# Patient Record
Sex: Male | Born: 1935 | Race: White | Hispanic: No | Marital: Married | Smoking: Former smoker
Health system: Southern US, Community
[De-identification: ages and names within clinical notes are randomized; demographics above are authoritative.]

## PROBLEM LIST (undated history)

## (undated) DIAGNOSIS — E785 Hyperlipidemia, unspecified: Secondary | ICD-10-CM

## (undated) DIAGNOSIS — H9190 Unspecified hearing loss, unspecified ear: Secondary | ICD-10-CM

## (undated) DIAGNOSIS — R0609 Other forms of dyspnea: Secondary | ICD-10-CM

## (undated) DIAGNOSIS — I2510 Atherosclerotic heart disease of native coronary artery without angina pectoris: Secondary | ICD-10-CM

## (undated) DIAGNOSIS — I10 Essential (primary) hypertension: Secondary | ICD-10-CM

## (undated) DIAGNOSIS — S6992XA Unspecified injury of left wrist, hand and finger(s), initial encounter: Secondary | ICD-10-CM

## (undated) DIAGNOSIS — T82857A Stenosis of cardiac prosthetic devices, implants and grafts, initial encounter: Secondary | ICD-10-CM

## (undated) DIAGNOSIS — R296 Repeated falls: Secondary | ICD-10-CM

---

## 2003-04-24 LAB — HM COLONOSCOPY

## 2004-04-30 ENCOUNTER — Ambulatory Visit: Payer: Self-pay | Admitting: Family Medicine

## 2004-05-28 ENCOUNTER — Ambulatory Visit: Payer: Self-pay | Admitting: Family Medicine

## 2005-01-07 ENCOUNTER — Ambulatory Visit: Payer: Self-pay | Admitting: Family Medicine

## 2006-12-06 ENCOUNTER — Ambulatory Visit: Payer: Self-pay | Admitting: Unknown Physician Specialty

## 2006-12-30 ENCOUNTER — Ambulatory Visit: Payer: Self-pay | Admitting: Family Medicine

## 2008-08-08 ENCOUNTER — Ambulatory Visit: Payer: Self-pay | Admitting: Family Medicine

## 2009-03-07 ENCOUNTER — Ambulatory Visit: Payer: Self-pay | Admitting: General Surgery

## 2009-03-12 ENCOUNTER — Ambulatory Visit: Payer: Self-pay | Admitting: General Surgery

## 2009-03-12 HISTORY — PX: HERNIA REPAIR: SHX51

## 2010-11-29 ENCOUNTER — Emergency Department: Payer: Self-pay | Admitting: *Deleted

## 2010-12-03 ENCOUNTER — Ambulatory Visit: Payer: Self-pay | Admitting: Family Medicine

## 2010-12-17 ENCOUNTER — Inpatient Hospital Stay: Payer: Self-pay | Admitting: *Deleted

## 2011-01-07 ENCOUNTER — Ambulatory Visit: Payer: Self-pay | Admitting: Cardiology

## 2011-02-10 DIAGNOSIS — E785 Hyperlipidemia, unspecified: Secondary | ICD-10-CM

## 2011-02-10 DIAGNOSIS — I10 Essential (primary) hypertension: Secondary | ICD-10-CM

## 2011-02-10 DIAGNOSIS — I498 Other specified cardiac arrhythmias: Secondary | ICD-10-CM

## 2011-02-17 ENCOUNTER — Ambulatory Visit: Payer: Self-pay | Admitting: Cardiology

## 2011-03-17 DIAGNOSIS — I499 Cardiac arrhythmia, unspecified: Secondary | ICD-10-CM

## 2011-03-17 DIAGNOSIS — I2510 Atherosclerotic heart disease of native coronary artery without angina pectoris: Secondary | ICD-10-CM

## 2011-03-17 DIAGNOSIS — I359 Nonrheumatic aortic valve disorder, unspecified: Secondary | ICD-10-CM

## 2011-03-18 ENCOUNTER — Other Ambulatory Visit: Payer: Self-pay | Admitting: *Deleted

## 2011-03-18 MED ORDER — AMLODIPINE BESYLATE 5 MG PO TABS: 5.0000 mg | ORAL_TABLET | Freq: Every day | ORAL | Status: AC

## 2011-03-18 MED ORDER — MOMETASONE FURO-FORMOTEROL FUM 200-5 MCG/ACT IN AERO: 2.0000 | INHALATION_SPRAY | Freq: Two times a day (BID) | RESPIRATORY_TRACT | Status: DC

## 2011-03-18 MED ORDER — METOPROLOL SUCCINATE ER 50 MG PO TB24
50.0000 mg | ORAL_TABLET | Freq: Every day | ORAL | Status: DC
Start: 2011-03-18 — End: 2017-02-16

## 2011-03-18 MED ORDER — FUROSEMIDE 80 MG PO TABS
80.0000 mg | ORAL_TABLET | Freq: Every day | ORAL | Status: DC
Start: 2011-03-18 — End: 2016-07-16

## 2011-03-18 MED ORDER — POTASSIUM CHLORIDE 20 MEQ PO PACK: 20.0000 meq | PACK | Freq: Every day | ORAL | Status: DC

## 2011-03-18 MED ORDER — PRAVASTATIN SODIUM 20 MG PO TABS: 20.0000 mg | ORAL_TABLET | Freq: Every day | ORAL | Status: AC

## 2011-03-18 NOTE — Telephone Encounter (Signed)
rx sent to pharmacy by e-script  

## 2011-04-29 ENCOUNTER — Ambulatory Visit: Payer: Self-pay | Admitting: Internal Medicine

## 2011-06-22 ENCOUNTER — Other Ambulatory Visit: Payer: Medicare Other

## 2011-07-14 ENCOUNTER — Other Ambulatory Visit: Payer: Medicare Other

## 2011-07-21 ENCOUNTER — Other Ambulatory Visit: Payer: Medicare Other

## 2013-07-07 DIAGNOSIS — I10 Essential (primary) hypertension: Secondary | ICD-10-CM | POA: Insufficient documentation

## 2013-07-07 DIAGNOSIS — I359 Nonrheumatic aortic valve disorder, unspecified: Secondary | ICD-10-CM | POA: Insufficient documentation

## 2013-11-09 DIAGNOSIS — I6529 Occlusion and stenosis of unspecified carotid artery: Secondary | ICD-10-CM | POA: Insufficient documentation

## 2013-12-14 LAB — BASIC METABOLIC PANEL
BUN: 12 mg/dL (ref 4–21)
Creatinine: 0.9 mg/dL (ref 0.6–1.3)
GLUCOSE: 107 mg/dL
POTASSIUM: 3.5 mmol/L (ref 3.4–5.3)
SODIUM: 140 mmol/L (ref 137–147)

## 2013-12-14 LAB — TSH: TSH: 2.87 u[IU]/mL (ref 0.41–5.90)

## 2013-12-14 LAB — CBC AND DIFFERENTIAL
HEMATOCRIT: 36 % — AB (ref 41–53)
Hemoglobin: 12.8 g/dL — AB (ref 13.5–17.5)
NEUTROS ABS: 5 /uL
Platelets: 186 10*3/uL (ref 150–399)
WBC: 7.5 10^3/mL

## 2013-12-14 LAB — LIPID PANEL
CHOLESTEROL: 171 mg/dL (ref 0–200)
HDL: 41 mg/dL (ref 35–70)
LDL Cholesterol: 104 mg/dL
LDL/HDL RATIO: 2.5
Triglycerides: 128 mg/dL (ref 40–160)

## 2013-12-14 LAB — HEPATIC FUNCTION PANEL
ALT: 11 U/L (ref 10–40)
AST: 16 U/L (ref 14–40)
Alkaline Phosphatase: 67 U/L (ref 25–125)

## 2014-07-18 LAB — HEMOGLOBIN A1C: Hgb A1c MFr Bld: 5.5 % (ref 4.0–6.0)

## 2015-01-15 DIAGNOSIS — E0590 Thyrotoxicosis, unspecified without thyrotoxic crisis or storm: Secondary | ICD-10-CM | POA: Insufficient documentation

## 2015-01-15 DIAGNOSIS — I509 Heart failure, unspecified: Secondary | ICD-10-CM | POA: Insufficient documentation

## 2015-01-15 DIAGNOSIS — J449 Chronic obstructive pulmonary disease, unspecified: Secondary | ICD-10-CM | POA: Insufficient documentation

## 2015-01-15 DIAGNOSIS — E785 Hyperlipidemia, unspecified: Secondary | ICD-10-CM | POA: Insufficient documentation

## 2015-01-15 DIAGNOSIS — E039 Hypothyroidism, unspecified: Secondary | ICD-10-CM | POA: Insufficient documentation

## 2015-01-15 DIAGNOSIS — M1990 Unspecified osteoarthritis, unspecified site: Secondary | ICD-10-CM | POA: Insufficient documentation

## 2015-01-15 DIAGNOSIS — I10 Essential (primary) hypertension: Secondary | ICD-10-CM | POA: Insufficient documentation

## 2015-01-15 DIAGNOSIS — I38 Endocarditis, valve unspecified: Secondary | ICD-10-CM | POA: Insufficient documentation

## 2015-01-15 DIAGNOSIS — S2220XA Unspecified fracture of sternum, initial encounter for closed fracture: Secondary | ICD-10-CM | POA: Insufficient documentation

## 2015-01-15 DIAGNOSIS — R7303 Prediabetes: Secondary | ICD-10-CM | POA: Insufficient documentation

## 2015-01-16 ENCOUNTER — Ambulatory Visit (INDEPENDENT_AMBULATORY_CARE_PROVIDER_SITE_OTHER): Payer: Medicare PPO | Admitting: Family Medicine

## 2015-01-16 ENCOUNTER — Encounter: Payer: Self-pay | Admitting: Family Medicine

## 2015-01-16 ENCOUNTER — Ambulatory Visit: Payer: Self-pay | Admitting: Family Medicine

## 2015-01-16 VITALS — BP 114/60 | HR 68 | Temp 97.9°F | Resp 16 | Wt 168.0 lb

## 2015-01-16 DIAGNOSIS — Z23 Encounter for immunization: Secondary | ICD-10-CM

## 2015-01-16 DIAGNOSIS — R7303 Prediabetes: Secondary | ICD-10-CM

## 2015-01-16 DIAGNOSIS — I10 Essential (primary) hypertension: Secondary | ICD-10-CM

## 2015-01-16 DIAGNOSIS — E785 Hyperlipidemia, unspecified: Secondary | ICD-10-CM

## 2015-01-16 DIAGNOSIS — E0590 Thyrotoxicosis, unspecified without thyrotoxic crisis or storm: Secondary | ICD-10-CM | POA: Diagnosis not present

## 2015-01-16 NOTE — Progress Notes (Signed)
Patient ID: Charles Strickland, male   DOB: [birth year], 81 y.o.   MRN: [id number]    Subjective:  HPI  Pre-Diabetes, Follow-up:   Lab Results  Component Value Date   HGBA1C 5.5 07/18/2014   Last seen for diabetes 6 months ago.  Management since then includes none. He reports good compliance with treatment. He is not having side effects.  Current symptoms include none.  Current Insulin Regimen: n/a  ------------------------------------------------------------------------   Hypertension, follow-up:  BP Readings from Last 3 Encounters:  01/16/15 114/60  07/18/14 164/62    He was last seen for hypertension 6 months ago.  BP at that visit was 164/62. Management since that visit includes none.He reports good compliance with treatment. He is not having side effects.  He is not exercising. He is adherent to low salt diet.   Outside blood pressures are not being checked. Patient denies chest pain, chest pressure/discomfort, dyspnea, exertional chest pressure/discomfort, fatigue, irregular heart beat and palpitations.    ------------------------------------------------------------------------    Lipid/Cholesterol, Follow-up:   Last seen for this 6 months ago.  Management since that visit includes none.  Last Lipid Panel:    Component Value Date/Time   CHOL 171 12/14/2013   TRIG 128 12/14/2013   HDL 41 12/14/2013   LDLCALC 104 12/14/2013    He reports good compliance with treatment. He is not having side effects.   Wt Readings from Last 3 Encounters:  01/16/15 168 lb (76.204 kg)  07/18/14 166 lb (75.297 kg)    ------------------------------------------------------------------------       Prior to Admission medications   Medication Sig Start Date End Date Taking? Authorizing Provider  albuterol (VENTOLIN HFA) 108 (90 BASE) MCG/ACT inhaler Inhale into the lungs. 05/17/12  Yes Historical Provider, MD  amLODipine (NORVASC) 5 MG tablet Take 1 tablet (5 mg total)  by mouth daily. 03/18/11  Yes Karie Schwalbe, MD  amLODipine (NORVASC) 5 MG tablet Take by mouth. 07/18/14  Yes Historical Provider, MD  aspirin 81 MG tablet Take by mouth. 05/17/12  Yes Historical Provider, MD  benazepril (LOTENSIN) 40 MG tablet Take by mouth. 07/18/14  Yes Historical Provider, MD  diphenhydrAMINE (ALLERGY MEDICATION) 25 MG tablet Take by mouth. 05/17/12  Yes Historical Provider, MD  furosemide (LASIX) 80 MG tablet Take 1 tablet (80 mg total) by mouth daily. 03/18/11  Yes Karie Schwalbe, MD  losartan-hydrochlorothiazide (HYZAAR) 100-25 MG tablet Take by mouth. 05/17/12  Yes Historical Provider, MD  Methylcobalamin (B12-ACTIVE) 1 MG CHEW Chew by mouth. 05/17/12  Yes Historical Provider, MD  metoprolol (TOPROL-XL) 50 MG 24 hr tablet Take 1 tablet (50 mg total) by mouth daily. 03/18/11  Yes Karie Schwalbe, MD  Mometasone Furo-Formoterol Fum (DULERA) 200-5 MCG/ACT AERO Inhale 2 puffs into the lungs 2 (two) times daily. 03/18/11  Yes Karie Schwalbe, MD  Multiple Vitamin tablet Take by mouth. 05/17/12  Yes Historical Provider, MD  potassium chloride (KLOR-CON) 20 MEQ packet Take 20 mEq by mouth daily. 03/18/11  Yes Karie Schwalbe, MD  pravastatin (PRAVACHOL) 20 MG tablet Take 1 tablet (20 mg total) by mouth daily. 03/18/11  Yes Karie Schwalbe, MD  Sennosides 25 MG TABS Take by mouth. 05/17/12  Yes Historical Provider, MD    Patient Active Problem List   Diagnosis Date Noted  . CCF (congestive cardiac failure) (HCC) 01/15/2015  . CAFL (chronic airflow limitation) (HCC) 01/15/2015  . Borderline diabetes 01/15/2015  . Heart valve disease 01/15/2015  . HLD (hyperlipidemia) 01/15/2015  . BP (  high blood pressure) 01/15/2015  . Hyperthyroidism 01/15/2015  . Adult hypothyroidism 01/15/2015  . Arthritis, degenerative 01/15/2015  . Closed fracture of sternum 01/15/2015  . Carotid artery occlusion 11/09/2013  . Benign essential HTN 07/07/2013  . Aortic valve defect 07/07/2013     History reviewed. No pertinent past medical history.  Social History   Social History  . Marital Status: Married    Spouse Name: N/A  . Number of Children: N/A  . Years of Education: N/A   Occupational History  . Not on file.   Social History Main Topics  . Smoking status: Former Smoker -- 3.00 packs/day  . Smokeless tobacco: Not on file     Comment: quit 40 years ago  . Alcohol Use: No  . Drug Use: No  . Sexual Activity: Not on file   Other Topics Concern  . Not on file   Social History Narrative    No Known Allergies  Review of Systems  Constitutional: Negative.   HENT: Negative.   Eyes: Negative.   Respiratory: Negative.   Cardiovascular: Negative.   Gastrointestinal: Negative.   Genitourinary: Negative.   Musculoskeletal: Negative.   Skin: Negative.   Neurological: Negative.   Endo/Heme/Allergies: Negative.   Psychiatric/Behavioral: Negative.     Immunization History  Administered Date(s) Administered  . Pneumococcal Polysaccharide-23 02/07/2013  . Td 06/08/2007   Objective:  BP 114/60 mmHg  Pulse 68  Temp(Src) 97.9 F (36.6 C) (Oral)  Resp 16  Wt 168 lb (76.204 kg)  Physical Exam  Constitutional: He is oriented to person, place, and time and well-developed, well-nourished, and in no distress.  HENT:  Head: Normocephalic and atraumatic.  Right Ear: External ear normal.  Left Ear: External ear normal.  Nose: Nose normal.  Eyes: Conjunctivae are normal.  Neck: Neck supple.  Cardiovascular: Normal rate and regular rhythm.   Pulmonary/Chest: Effort normal and breath sounds normal.  Abdominal: Soft.  Neurological: He is alert and oriented to person, place, and time.  Skin: Skin is warm and dry.  Psychiatric: Mood, memory, affect and judgment normal.    Lab Results  Component Value Date   WBC 7.5 12/14/2013   HGB 12.8* 12/14/2013   HCT 36* 12/14/2013   PLT 186 12/14/2013   CHOL 171 12/14/2013   TRIG 128 12/14/2013   HDL 41  12/14/2013   LDLCALC 104 12/14/2013   TSH 2.87 12/14/2013   HGBA1C 5.5 07/18/2014    CMP     Component Value Date/Time   NA 140 12/14/2013   K 3.5 12/14/2013   BUN 12 12/14/2013   CREATININE 0.9 12/14/2013   AST 16 12/14/2013   ALT 11 12/14/2013   ALKPHOS 67 12/14/2013    Assessment and Plan :  1. Benign essential HTN  - CBC with Differential/Platelet - TSH  2. Hyperthyroidism  - TSH  3. Borderline diabetes  - Hemoglobin A1c - Comprehensive metabolic panel  4. HLD (hyperlipidemia)  - Lipid Panel With LDL/HDL Ratio - Comprehensive metabolic panel  5. Need for influenza vaccination  - Flu vaccine HIGH DOSE PF  6. Need for pneumococcal vaccination  - Pneumococcal conjugate vaccine 13-valent IM  7. Prediabetes  - Hemoglobin A1c  I have done the exam and reviewed the above chart and it is accurate to the best of my knowledge.  Julieanne Mansonichard Cereniti Curb MD [facility] 01/16/2015 1:59 PM

## 2015-01-17 LAB — CBC WITH DIFFERENTIAL/PLATELET
BASOS: 1 %
Basophils Absolute: 0.1 10*3/uL (ref 0.0–0.2)
EOS (ABSOLUTE): 0.4 10*3/uL (ref 0.0–0.4)
Eos: 5 %
Hematocrit: 36.8 % — ABNORMAL LOW (ref 37.5–51.0)
Hemoglobin: 12.5 g/dL — ABNORMAL LOW (ref 12.6–17.7)
IMMATURE GRANULOCYTES: 0 %
Immature Grans (Abs): 0 10*3/uL (ref 0.0–0.1)
Lymphocytes Absolute: 1.8 10*3/uL (ref 0.7–3.1)
Lymphs: 22 %
MCH: 29.1 pg (ref 26.6–33.0)
MCHC: 34 g/dL (ref 31.5–35.7)
MCV: 86 fL (ref 79–97)
MONOS ABS: 0.7 10*3/uL (ref 0.1–0.9)
Monocytes: 8 %
NEUTROS PCT: 64 %
Neutrophils Absolute: 5.2 10*3/uL (ref 1.4–7.0)
PLATELETS: 189 10*3/uL (ref 150–379)
RBC: 4.3 x10E6/uL (ref 4.14–5.80)
RDW: 14.7 % (ref 12.3–15.4)
WBC: 8.2 10*3/uL (ref 3.4–10.8)

## 2015-01-17 LAB — LIPID PANEL WITH LDL/HDL RATIO
CHOLESTEROL TOTAL: 162 mg/dL (ref 100–199)
HDL: 34 mg/dL — ABNORMAL LOW (ref >39)
LDL CALC: 102 mg/dL — AB (ref 0–99)
LDL/HDL RATIO: 3 ratio (ref 0.0–3.6)
Triglycerides: 131 mg/dL (ref 0–149)
VLDL Cholesterol Cal: 26 mg/dL (ref 5–40)

## 2015-01-17 LAB — COMPREHENSIVE METABOLIC PANEL
ALBUMIN: 4.3 g/dL (ref 3.2–4.6)
ALK PHOS: 60 IU/L (ref 39–117)
ALT: 14 IU/L (ref 0–44)
AST: 20 IU/L (ref 0–40)
Albumin/Globulin Ratio: 1.8 (ref 1.1–2.5)
BUN/Creatinine Ratio: 18 (ref 10–22)
BUN: 17 mg/dL (ref 10–36)
Bilirubin Total: 0.5 mg/dL (ref 0.0–1.2)
CO2: 27 mmol/L (ref 18–29)
CREATININE: 0.93 mg/dL (ref 0.76–1.27)
Calcium: 10.2 mg/dL (ref 8.6–10.2)
Chloride: 99 mmol/L (ref 97–106)
GFR calc Af Amer: 83 mL/min/{1.73_m2} (ref >59)
GFR calc non Af Amer: 72 mL/min/{1.73_m2} (ref >59)
GLUCOSE: 119 mg/dL — AB (ref 65–99)
Globulin, Total: 2.4 g/dL (ref 1.5–4.5)
Potassium: 3.7 mmol/L (ref 3.5–5.2)
Sodium: 142 mmol/L (ref 136–144)
Total Protein: 6.7 g/dL (ref 6.0–8.5)

## 2015-01-17 LAB — TSH: TSH: 2.12 u[IU]/mL (ref 0.450–4.500)

## 2015-01-17 LAB — HEMOGLOBIN A1C
Est. average glucose Bld gHb Est-mCnc: 117 mg/dL
Hgb A1c MFr Bld: 5.7 % — ABNORMAL HIGH (ref 4.8–5.6)

## 2015-01-17 NOTE — Progress Notes (Signed)
Advised  ED 

## 2015-04-09 ENCOUNTER — Ambulatory Visit: Payer: Medicare PPO | Admitting: Family Medicine

## 2015-04-10 ENCOUNTER — Ambulatory Visit (INDEPENDENT_AMBULATORY_CARE_PROVIDER_SITE_OTHER): Payer: Medicare Other | Admitting: Family Medicine

## 2015-04-10 ENCOUNTER — Encounter: Payer: Self-pay | Admitting: Family Medicine

## 2015-04-10 VITALS — BP 142/68 | Temp 97.9°F | Wt 170.0 lb

## 2015-04-10 DIAGNOSIS — N419 Inflammatory disease of prostate, unspecified: Secondary | ICD-10-CM | POA: Diagnosis not present

## 2015-04-10 DIAGNOSIS — N400 Enlarged prostate without lower urinary tract symptoms: Secondary | ICD-10-CM | POA: Diagnosis not present

## 2015-04-10 DIAGNOSIS — J449 Chronic obstructive pulmonary disease, unspecified: Secondary | ICD-10-CM | POA: Diagnosis not present

## 2015-04-10 MED ORDER — SULFAMETHOXAZOLE-TRIMETHOPRIM 800-160 MG PO TABS: 1.0000 | ORAL_TABLET | Freq: Two times a day (BID) | ORAL | Status: DC

## 2015-04-10 MED ORDER — TAMSULOSIN HCL 0.4 MG PO CAPS: 0.4000 mg | ORAL_CAPSULE | Freq: Every day | ORAL | Status: DC

## 2015-04-10 NOTE — Progress Notes (Signed)
Patient ID: Charles Strickland, male   DOB: 17-Feb-1921, 82 y.o.   MRN: [id number]       [name]    DOB: Jan 16, 1921   82 y.o.   MRN: [id number] Visit Date: 04/10/2015  Today's Provider: Megan Mans, MD   Chief Complaint  Patient presents with  . Urinary Frequency    3-4 months.    Subjective:    Urinary Frequency  This is a new problem. The current episode started more than 1 month ago. The problem occurs intermittently. The problem has been gradually worsening. The patient is experiencing no pain. There has been no fever. There is no history of pyelonephritis. Associated symptoms include frequency and urgency. Pertinent negatives include no flank pain, hematuria, hesitancy or nausea. He has tried nothing for the symptoms. There is no history of kidney stones, recurrent UTIs or a urological procedure.  Patient is concerned that his prostate may be enlarged. He reports that he doesn't think that his bladder is emptying completley. He reports that after voiding, will pass until he feels the need to void again. He also reports that he gets up 3-4 times at night to use the bathroom.      No Known Allergies Previous Medications   ALBUTEROL (VENTOLIN HFA) 108 (90 BASE) MCG/ACT INHALER    Inhale into the lungs.   AMLODIPINE (NORVASC) 5 MG TABLET    Take 1 tablet (5 mg total) by mouth daily.   AMLODIPINE (NORVASC) 5 MG TABLET    Take by mouth.   ASPIRIN 81 MG TABLET    Take by mouth.   BENAZEPRIL (LOTENSIN) 40 MG TABLET    Take by mouth.   DIPHENHYDRAMINE (ALLERGY MEDICATION) 25 MG TABLET    Take by mouth.   FUROSEMIDE (LASIX) 80 MG TABLET    Take 1 tablet (80 mg total) by mouth daily.   LOSARTAN-HYDROCHLOROTHIAZIDE (HYZAAR) 100-25 MG TABLET    Take by mouth.   METHYLCOBALAMIN (B12-ACTIVE) 1 MG CHEW    Chew by mouth.   METOPROLOL (TOPROL-XL) 50 MG 24 HR TABLET    Take 1 tablet (50 mg total) by mouth daily.   MOMETASONE FURO-FORMOTEROL FUM (DULERA) 200-5 MCG/ACT  AERO    Inhale 2 puffs into the lungs 2 (two) times daily.   MULTIPLE VITAMIN TABLET    Take by mouth.   POTASSIUM CHLORIDE (KLOR-CON) 20 MEQ PACKET    Take 20 mEq by mouth daily.   PRAVASTATIN (PRAVACHOL) 20 MG TABLET    Take 1 tablet (20 mg total) by mouth daily.   SENNOSIDES 25 MG TABS    Take by mouth.    Review of Systems  Constitutional: Negative.   Eyes: Negative.   Gastrointestinal: Negative for nausea.  Endocrine: Negative.   Genitourinary: Positive for urgency and frequency. Negative for dysuria, hesitancy, hematuria, flank pain, decreased urine volume, difficulty urinating and testicular pain.  Allergic/Immunologic: Negative.   Hematological: Negative.   Psychiatric/Behavioral: Negative.     Social History  Substance Use Topics  . Smoking status: Former Smoker -- 3.00 packs/day  . Smokeless tobacco: Not on file     Comment: quit 40 years ago  . Alcohol Use: No   Objective:   BP 142/68 mmHg  Temp(Src) 97.9 F (36.6 C)  Wt 170 lb (77.111 kg)  Physical Exam  Constitutional: He is oriented to person, place, and time. He appears well-developed and well-nourished.  HENT:  Head: Normocephalic and atraumatic.  Right Ear: External ear normal.  Left  Ear: External ear normal.  Nose: Nose normal.  Eyes: Conjunctivae are normal.  Neck: Neck supple.  Cardiovascular: Normal rate, regular rhythm and normal heart sounds.   Pulmonary/Chest: Effort normal and breath sounds normal.  Abdominal: Soft.  Neurological: He is alert and oriented to person, place, and time.  Skin: Skin is warm and dry.  Psychiatric: He has a normal mood and affect. His behavior is normal. Judgment and thought content normal.        Assessment & Plan:     1. Prostatitis, unspecified prostatitis type New Problem. Treat as below.  - sulfamethoxazole-trimethoprim (BACTRIM DS,SEPTRA DS) 800-160 MG tablet; Take 1 tablet by mouth 2 (two) times daily.  Dispense: 28 tablet; Refill: 0  2. BPH (benign  prostatic hyperplasia) - tamsulosin (FLOMAX) 0.4 MG CAPS capsule; Take 1 capsule (0.4 mg total) by mouth daily.  Dispense: 30 capsule; Refill: 12  3. COPD, mild (HCC) Patient reports that he has a cough and has noticed increase in sputum. Due to smoking history (patient smoked 3ppd X 25 years), recommended patient to try Robitussin nightly to decrease symptoms.  May need spirometry on next visit. 4.Status post aortic valve replacement 5. Hypertension 6. Hyperlipidemia 7. CAD All risk factors treated I have done the exam and reviewed the above chart and it is accurate to the best of my knowledge.       [name], MD  [facility]

## 2015-04-26 ENCOUNTER — Ambulatory Visit (INDEPENDENT_AMBULATORY_CARE_PROVIDER_SITE_OTHER): Payer: Medicare Other | Admitting: Family Medicine

## 2015-04-26 ENCOUNTER — Encounter: Payer: Self-pay | Admitting: Family Medicine

## 2015-04-26 VITALS — BP 130/50 | HR 82 | Temp 97.5°F | Resp 16 | Wt 170.0 lb

## 2015-04-26 DIAGNOSIS — T887XXA Unspecified adverse effect of drug or medicament, initial encounter: Secondary | ICD-10-CM | POA: Diagnosis not present

## 2015-04-26 DIAGNOSIS — T50905A Adverse effect of unspecified drugs, medicaments and biological substances, initial encounter: Secondary | ICD-10-CM

## 2015-04-26 MED ORDER — PREDNISONE 10 MG PO TABS: ORAL_TABLET | ORAL | Status: DC

## 2015-04-26 NOTE — Patient Instructions (Addendum)
Stop Sulfa antibiotic. May try two Flomax daily and see if urination is better over the next week or two. If so, call and we can adjust the prescription. Try Claritin for itching.

## 2015-04-26 NOTE — Progress Notes (Signed)
Subjective:     Patient ID: Charles Strickland, male   DOB: [birth year], 82 y.o.   MRN: [id number]  HPI  Chief Complaint  Patient presents with  . Skin Problem    Patient comes in office today with concerns of a rash on his skin that has been present for two weeks or more. Skin appears to be red and scaly, patient describes it as very itchy. Rash has now spread to hands, legs and back, patient has applied body lotion to skin and Fluocinonide cream  Rash coincides with placement on Septra for prostatitis at last office visit. He has also tried Benadryl with little improvement. Accompanied by his daughter today.   Review of Systems     Objective:   Physical Exam  Constitutional: He appears well-developed and well-nourished. He appears distressed (itching).  HENT:  No oral rash noted  Skin:  Multiple 1 cm areas of excoriated, mildly rash rash on extremities, back, and flanks. No central cleaning       Assessment:    1. Drug reaction, initial encounter - predniSONE (DELTASONE) 10 MG tablet; Taper daily as follows: 6 pills, 5, 4, 3, 2, 1  Dispense: 21 tablet; Refill: 0    Plan:    Stop Sulfa, add Clartin. May increase Flomax to two pills daily.

## 2015-05-01 ENCOUNTER — Encounter: Payer: Self-pay | Admitting: Family Medicine

## 2015-05-02 ENCOUNTER — Other Ambulatory Visit: Payer: Self-pay

## 2015-05-02 DIAGNOSIS — N400 Enlarged prostate without lower urinary tract symptoms: Secondary | ICD-10-CM

## 2015-05-02 MED ORDER — TAMSULOSIN HCL 0.4 MG PO CAPS: ORAL_CAPSULE | ORAL | Status: DC

## 2015-07-17 ENCOUNTER — Ambulatory Visit (INDEPENDENT_AMBULATORY_CARE_PROVIDER_SITE_OTHER): Payer: Medicare Other | Admitting: Family Medicine

## 2015-07-17 ENCOUNTER — Encounter: Payer: Self-pay | Admitting: Family Medicine

## 2015-07-17 VITALS — BP 126/60 | HR 80 | Temp 97.8°F | Resp 16 | Ht 68.0 in | Wt 175.0 lb

## 2015-07-17 DIAGNOSIS — Z0000 Encounter for general adult medical examination without abnormal findings: Secondary | ICD-10-CM | POA: Diagnosis not present

## 2015-07-17 DIAGNOSIS — N400 Enlarged prostate without lower urinary tract symptoms: Secondary | ICD-10-CM | POA: Diagnosis not present

## 2015-07-17 MED ORDER — TAMSULOSIN HCL 0.4 MG PO CAPS: ORAL_CAPSULE | ORAL | Status: DC

## 2015-07-17 NOTE — Progress Notes (Signed)
Patient ID: Charles Strickland, male   DOB: [birth year], 82 y.o.   MRN: [id number]       Patient: Charles Strickland, Male    DOB: [birth year], 82 y.o.   MRN: [id number] Visit Date: 07/17/2015  Today's Provider: Megan Mansichard Victor Granados Jr, MD   Chief Complaint  Patient presents with  . Medicare Wellness Visit   Subjective:    Annual wellness visit Charles Strickland is a 82 y.o. male. He feels fairly well. He reports exercising occasionally. He does yard work.  He reports he is sleeping well.  Colonoscopy- 04/24/2003. Diverticulosis. Td- 06/08/2007 Pneumovax23- 02/07/2013 Prevnar13-01/16/2015    Review of Systems  Constitutional: Positive for activity change.  HENT: Positive for postnasal drip.   Eyes: Negative.   Respiratory: Positive for shortness of breath. Negative for apnea, cough, choking, chest tightness and wheezing.   Cardiovascular: Negative.   Gastrointestinal: Negative.   Endocrine: Negative.   Genitourinary: Negative.   Musculoskeletal: Positive for arthralgias and gait problem.  Skin: Negative.   Allergic/Immunologic: Negative.   Neurological: Negative.   Hematological: Negative.   Psychiatric/Behavioral: Negative.   Chronic worsening left knee pain. Mild DOE, mainly when he mows the yard. Social History   Social History  . Marital Status: Married    Spouse Name: N/A  . Number of Children: N/A  . Years of Education: N/A   Occupational History  . Not on file.   Social History Main Topics  . Smoking status: Former Smoker -- 3.00 packs/day  . Smokeless tobacco: Not on file     Comment: quit 40 years ago  . Alcohol Use: No  . Drug Use: No  . Sexual Activity: Not on file   Other Topics Concern  . Not on file   Social History Narrative    No past medical history on file.   Patient Active Problem List   Diagnosis Date Noted  . CCF (congestive cardiac failure) (HCC) 01/15/2015  . CAFL (chronic airflow limitation) (HCC) 01/15/2015  . Borderline diabetes  01/15/2015  . Heart valve disease 01/15/2015  . HLD (hyperlipidemia) 01/15/2015  . BP (high blood pressure) 01/15/2015  . Hyperthyroidism 01/15/2015  . Adult hypothyroidism 01/15/2015  . Arthritis, degenerative 01/15/2015  . Closed fracture of sternum 01/15/2015  . Carotid artery occlusion 11/09/2013  . Benign essential HTN 07/07/2013  . Aortic valve defect 07/07/2013    Past Surgical History  Procedure Laterality Date  . Hernia repair  03/12/09    right    His family history is not on file.    Previous Medications   ALBUTEROL (VENTOLIN HFA) 108 (90 BASE) MCG/ACT INHALER    Inhale into the lungs.   AMLODIPINE (NORVASC) 5 MG TABLET    Take 1 tablet (5 mg total) by mouth daily.   AMLODIPINE (NORVASC) 5 MG TABLET    Take by mouth. Reported on 07/17/2015   ASPIRIN 81 MG TABLET    Take by mouth.   BENAZEPRIL (LOTENSIN) 40 MG TABLET    Take by mouth.   DIPHENHYDRAMINE (ALLERGY MEDICATION) 25 MG TABLET    Take by mouth.   FUROSEMIDE (LASIX) 80 MG TABLET    Take 1 tablet (80 mg total) by mouth daily.   LOSARTAN-HYDROCHLOROTHIAZIDE (HYZAAR) 100-25 MG TABLET    Take by mouth.   METHYLCOBALAMIN (B12-ACTIVE) 1 MG CHEW    Chew by mouth.   METOPROLOL (TOPROL-XL) 50 MG 24 HR TABLET    Take 1 tablet (50 mg total) by mouth daily.   MISC NATURAL PRODUCTS (NARCOSOFT HERBAL  LAX PO)    Take by mouth.   MOMETASONE FURO-FORMOTEROL FUM (DULERA) 200-5 MCG/ACT AERO    Inhale 2 puffs into the lungs 2 (two) times daily.   MULTIPLE VITAMIN TABLET    Take by mouth.   POTASSIUM CHLORIDE (KLOR-CON) 20 MEQ PACKET    Take 20 mEq by mouth daily.   PRAVASTATIN (PRAVACHOL) 20 MG TABLET    Take 1 tablet (20 mg total) by mouth daily.   SENNOSIDES 25 MG TABS    Take by mouth.   TAMSULOSIN (FLOMAX) 0.4 MG CAPS CAPSULE    Take 2 tablets daily    Patient Care Team: Maple Hudson., MD as PCP - General (Family Medicine)     Objective:   Vitals: BP 126/60 mmHg  Pulse 80  Temp(Src) 97.8 F (36.6 C)  Resp  16  Ht  (1.727 m)  Wt 175 lb (79.379 kg)  BMI 26.61 kg/m2  Physical Exam  Constitutional: He is oriented to person, place, and time. He appears well-developed and well-nourished.  HENT:  Head: Normocephalic and atraumatic.  Right Ear: External ear normal.  Left Ear: External ear normal.  Nose: Nose normal.  Mouth/Throat: Oropharynx is clear and moist.  Eyes: Conjunctivae and EOM are normal. Pupils are equal, round, and reactive to light.  Neck: Normal range of motion. Neck supple.  Cardiovascular: Normal rate, regular rhythm, normal heart sounds and intact distal pulses.   Pulmonary/Chest: Effort normal and breath sounds normal.  Abdominal: Soft. Bowel sounds are normal.  Musculoskeletal: Normal range of motion.  Neurological: He is alert and oriented to person, place, and time.  Skin: Skin is warm and dry.  Psychiatric: He has a normal mood and affect. His behavior is normal. Judgment and thought content normal.    Activities of Daily Living No flowsheet data found.  Fall Risk Assessment Fall Risk  07/17/2015 01/16/2015  Falls in the past year? No No     Depression Screen PHQ 2/9 Scores 07/17/2015 01/16/2015  PHQ - 2 Score 0 0    Cognitive Testing - 6-CIT  Correct? Score   What year is it? yes 0 0 or 4  What month is it? yes 0 0 or 3  Memorize:    Charles Strickland,  44,  [address],  [place],      What time is it? (within 1 hour) yes 0 0 or 3  Count backwards from 20 yes 0 0, 2, or 4  Name the months of the year no 4 0, 2, or 4  Repeat name & address above no 2 0, 2, 4, 6, 8, or 10       TOTAL SCORE  6/28   Interpretation:  Normal  Normal (0-7) Abnormal (8-28)       Assessment & Plan:     Annual Wellness Visit  Reviewed patient's Family Medical History Reviewed and updated list of patient's medical providers Assessment of cognitive impairment was done Assessed patient's functional ability Established a written schedule for health screening  services Health Risk Assessent Completed and Reviewed  Exercise Activities and Dietary recommendations Goals    None      Immunization History  Administered Date(s) Administered  . Influenza, High Dose Seasonal PF 01/16/2015  . Pneumococcal Conjugate-13 01/16/2015  . Pneumococcal Polysaccharide-23 02/07/2013  . Td 06/08/2007    Health Maintenance  Topic Date Due  . ZOSTAVAX  03/24/1983  . INFLUENZA VACCINE  10/29/2015  . TETANUS/TDAP  06/07/2017  . PNA vac  Low Risk Adult  Completed      Discussed health benefits of physical activity, and encouraged him to engage in regular exercise appropriate for his age and condition.                Osteoarthritis of the left knee             ASCVD             BPH--try tamsulosin daily. I have done the exam and reviewed the above chart and it is accurate to the best of my knowledge.   ------------------------------------------------------------------------------------------------------------

## 2015-10-16 ENCOUNTER — Ambulatory Visit: Payer: Medicare Other | Admitting: Family Medicine

## 2015-10-28 ENCOUNTER — Ambulatory Visit (INDEPENDENT_AMBULATORY_CARE_PROVIDER_SITE_OTHER): Payer: Medicare Other | Admitting: Family Medicine

## 2015-10-28 VITALS — BP 122/58 | HR 64 | Temp 98.8°F | Wt 172.0 lb

## 2015-10-28 DIAGNOSIS — I10 Essential (primary) hypertension: Secondary | ICD-10-CM | POA: Diagnosis not present

## 2015-10-28 DIAGNOSIS — R7303 Prediabetes: Secondary | ICD-10-CM

## 2015-10-28 DIAGNOSIS — E785 Hyperlipidemia, unspecified: Secondary | ICD-10-CM

## 2015-10-28 DIAGNOSIS — E0590 Thyrotoxicosis, unspecified without thyrotoxic crisis or storm: Secondary | ICD-10-CM | POA: Diagnosis not present

## 2015-10-28 NOTE — Progress Notes (Signed)
Charles Strickland  MRN: [id number] DOB: [birth year]  Subjective:  HPI   The patient is a 82 year old male who presents for follow up his chronic issues.  The patient takes Amlodipine, Losartan HCTZ and Benazepril for his hypertension.  He has not been taking his blood pressure since it has been well controlled for a long time now. Overall he feels well. No significant complaints.  Patient Active Problem List   Diagnosis Date Noted  . CCF (congestive cardiac failure) (HCC) 01/15/2015  . CAFL (chronic airflow limitation) (HCC) 01/15/2015  . Borderline diabetes 01/15/2015  . Heart valve disease 01/15/2015  . HLD (hyperlipidemia) 01/15/2015  . BP (high blood pressure) 01/15/2015  . Hyperthyroidism 01/15/2015  . Adult hypothyroidism 01/15/2015  . Arthritis, degenerative 01/15/2015  . Closed fracture of sternum 01/15/2015  . Carotid artery occlusion 11/09/2013  . Benign essential HTN 07/07/2013  . Aortic valve defect 07/07/2013    No past medical history on file.  Social History   Social History  . Marital status: Married    Spouse name: N/A  . Number of children: N/A  . Years of education: N/A   Occupational History  . Not on file.   Social History Main Topics  . Smoking status: Former Smoker    Packs/day: 3.00  . Smokeless tobacco: Not on file     Comment: quit 40 years ago  . Alcohol use No  . Drug use: No  . Sexual activity: Not on file   Other Topics Concern  . Not on file   Social History Narrative  . No narrative on file    Outpatient Medications Prior to Visit  Medication Sig Dispense Refill  . albuterol (VENTOLIN HFA) 108 (90 BASE) MCG/ACT inhaler Inhale into the lungs.    Marland Kitchen amLODipine (NORVASC) 5 MG tablet Take 1 tablet (5 mg total) by mouth daily. 30 tablet 0  . aspirin 81 MG tablet Take by mouth.    . benazepril (LOTENSIN) 40 MG tablet Take by mouth.    . losartan-hydrochlorothiazide (HYZAAR) 100-25 MG tablet Take by mouth.    . Methylcobalamin  (B12-ACTIVE) 1 MG CHEW Chew by mouth.    . metoprolol (TOPROL-XL) 50 MG 24 hr tablet Take 1 tablet (50 mg total) by mouth daily. 30 tablet 0  . Misc Natural Products (NARCOSOFT HERBAL LAX PO) Take by mouth.    . Mometasone Furo-Formoterol Fum (DULERA) 200-5 MCG/ACT AERO Inhale 2 puffs into the lungs 2 (two) times daily. 13 g 0  . Multiple Vitamin tablet Take by mouth.    . pravastatin (PRAVACHOL) 20 MG tablet Take 1 tablet (20 mg total) by mouth daily. 30 tablet 0  . tamsulosin (FLOMAX) 0.4 MG CAPS capsule Take 2 tablets daily 180 capsule 3  . amLODipine (NORVASC) 5 MG tablet Take by mouth. Reported on 07/17/2015    . furosemide (LASIX) 80 MG tablet Take 1 tablet (80 mg total) by mouth daily. (Patient not taking: Reported on 10/28/2015) 30 tablet 0  . potassium chloride (KLOR-CON) 20 MEQ packet Take 20 mEq by mouth daily. (Patient not taking: Reported on 10/28/2015) 30 tablet 0  . Sennosides 25 MG TABS Take by mouth.    . diphenhydrAMINE (ALLERGY MEDICATION) 25 MG tablet Take by mouth.     No facility-administered medications prior to visit.     Allergies  Allergen Reactions  . Sulfa Antibiotics Rash    Review of Systems  Eyes: Negative.   Respiratory: Negative for cough, shortness of breath and  wheezing.   Cardiovascular: Negative for chest pain, palpitations, orthopnea, claudication, leg swelling and PND.  Gastrointestinal: Negative.   Neurological: Negative for dizziness and headaches.  Endo/Heme/Allergies: Negative.   Psychiatric/Behavioral: Negative.    Objective:  BP (!) 122/58   Pulse 64   Temp 98.8 F (37.1 C) (Oral)   Wt 172 lb (78 kg)   BMI 26.15 kg/m   Physical Exam  Constitutional: He is well-developed, well-nourished, and in no distress.  HENT:  Head: Normocephalic and atraumatic.  Right Ear: External ear normal.  Left Ear: External ear normal.  Nose: Nose normal.  Eyes: Pupils are equal, round, and reactive to light.  Neck: Normal range of motion.    Cardiovascular: Normal rate and normal heart sounds.   Pulmonary/Chest: Effort normal and breath sounds normal.  Abdominal: Soft.  Skin: Skin is warm and dry.  Venous stasis changes of the lower extremities. Sun damaged skin of the forearms.  Psychiatric: Mood, memory, affect and judgment normal.    Assessment and Plan :    1. Benign essential HTN  - CBC with Differential/Platelet - Comprehensive metabolic panel  2. Borderline diabetes  - Hemoglobin A1c  3. Hyperthyroidism  - TSH  4. HLD (hyperlipidemia)  - Lipid Panel With LDL/HDL Ratio 5. CAD 6. Status post aortic valve replacement I have done the exam and reviewed the above chart and it is accurate to the best of my knowledge.  Julieanne Manson MD [facility] 10/28/2015 3:35 PM

## 2015-10-30 LAB — CBC WITH DIFFERENTIAL/PLATELET
Basophils Absolute: 0.1 10*3/uL (ref 0.0–0.2)
Basos: 1 %
EOS (ABSOLUTE): 0.3 10*3/uL (ref 0.0–0.4)
EOS: 4 %
HEMATOCRIT: 35 % — AB (ref 37.5–51.0)
HEMOGLOBIN: 11.7 g/dL — AB (ref 12.6–17.7)
Immature Grans (Abs): 0 10*3/uL (ref 0.0–0.1)
Immature Granulocytes: 0 %
LYMPHS ABS: 1.7 10*3/uL (ref 0.7–3.1)
Lymphs: 24 %
MCH: 28.5 pg (ref 26.6–33.0)
MCHC: 33.4 g/dL (ref 31.5–35.7)
MCV: 85 fL (ref 79–97)
MONOCYTES: 6 %
MONOS ABS: 0.4 10*3/uL (ref 0.1–0.9)
NEUTROS ABS: 4.8 10*3/uL (ref 1.4–7.0)
Neutrophils: 65 %
PLATELETS: 156 10*3/uL (ref 150–379)
RBC: 4.1 x10E6/uL — AB (ref 4.14–5.80)
RDW: 14.3 % (ref 12.3–15.4)
WBC: 7.3 10*3/uL (ref 3.4–10.8)

## 2015-10-30 LAB — COMPREHENSIVE METABOLIC PANEL
A/G RATIO: 1.6 (ref 1.2–2.2)
ALBUMIN: 3.9 g/dL (ref 3.2–4.6)
ALT: 12 IU/L (ref 0–44)
AST: 19 IU/L (ref 0–40)
Alkaline Phosphatase: 62 IU/L (ref 39–117)
BUN / CREAT RATIO: 10 (ref 10–24)
BUN: 10 mg/dL (ref 10–36)
Bilirubin Total: 0.4 mg/dL (ref 0.0–1.2)
CALCIUM: 9.3 mg/dL (ref 8.6–10.2)
CO2: 26 mmol/L (ref 18–29)
CREATININE: 1 mg/dL (ref 0.76–1.27)
Chloride: 102 mmol/L (ref 96–106)
GFR, EST AFRICAN AMERICAN: 75 mL/min/{1.73_m2} (ref >59)
GFR, EST NON AFRICAN AMERICAN: 65 mL/min/{1.73_m2} (ref >59)
GLOBULIN, TOTAL: 2.4 g/dL (ref 1.5–4.5)
Glucose: 96 mg/dL (ref 65–99)
Potassium: 3.5 mmol/L (ref 3.5–5.2)
SODIUM: 142 mmol/L (ref 134–144)
TOTAL PROTEIN: 6.3 g/dL (ref 6.0–8.5)

## 2015-10-30 LAB — LIPID PANEL WITH LDL/HDL RATIO
Cholesterol, Total: 137 mg/dL (ref 100–199)
HDL: 33 mg/dL — AB (ref >39)
LDL CALC: 85 mg/dL (ref 0–99)
LDl/HDL Ratio: 2.6 ratio units (ref 0.0–3.6)
Triglycerides: 96 mg/dL (ref 0–149)
VLDL CHOLESTEROL CAL: 19 mg/dL (ref 5–40)

## 2015-10-30 LAB — TSH: TSH: 3.65 u[IU]/mL (ref 0.450–4.500)

## 2015-10-30 LAB — HEMOGLOBIN A1C
ESTIMATED AVERAGE GLUCOSE: 114 mg/dL
Hgb A1c MFr Bld: 5.6 % (ref 4.8–5.6)

## 2016-02-02 ENCOUNTER — Encounter: Payer: Self-pay | Admitting: Family Medicine

## 2016-04-28 ENCOUNTER — Ambulatory Visit: Payer: Medicare Other | Admitting: Family Medicine

## 2016-06-17 ENCOUNTER — Telehealth: Payer: Self-pay | Admitting: Family Medicine

## 2016-06-17 NOTE — Telephone Encounter (Signed)
Called Pt to schedule AWV with [facility] - knb °

## 2016-07-16 ENCOUNTER — Ambulatory Visit (INDEPENDENT_AMBULATORY_CARE_PROVIDER_SITE_OTHER): Payer: Medicare Other

## 2016-07-16 ENCOUNTER — Telehealth: Payer: Self-pay

## 2016-07-16 ENCOUNTER — Ambulatory Visit (INDEPENDENT_AMBULATORY_CARE_PROVIDER_SITE_OTHER): Payer: Medicare Other | Admitting: Family Medicine

## 2016-07-16 VITALS — BP 136/58 | HR 72 | Temp 97.6°F | Resp 16 | Wt 170.0 lb

## 2016-07-16 VITALS — BP 136/58 | HR 72 | Temp 97.4°F | Ht 68.0 in | Wt 170.6 lb

## 2016-07-16 DIAGNOSIS — J449 Chronic obstructive pulmonary disease, unspecified: Secondary | ICD-10-CM

## 2016-07-16 DIAGNOSIS — R739 Hyperglycemia, unspecified: Secondary | ICD-10-CM | POA: Diagnosis not present

## 2016-07-16 DIAGNOSIS — N400 Enlarged prostate without lower urinary tract symptoms: Secondary | ICD-10-CM | POA: Diagnosis not present

## 2016-07-16 DIAGNOSIS — Z0000 Encounter for general adult medical examination without abnormal findings: Secondary | ICD-10-CM | POA: Diagnosis not present

## 2016-07-16 DIAGNOSIS — E784 Other hyperlipidemia: Secondary | ICD-10-CM

## 2016-07-16 DIAGNOSIS — I10 Essential (primary) hypertension: Secondary | ICD-10-CM

## 2016-07-16 DIAGNOSIS — E7849 Other hyperlipidemia: Secondary | ICD-10-CM

## 2016-07-16 MED ORDER — MOMETASONE FURO-FORMOTEROL FUM 200-5 MCG/ACT IN AERO: 2.0000 | INHALATION_SPRAY | Freq: Two times a day (BID) | RESPIRATORY_TRACT | 12 refills | Status: AC

## 2016-07-16 MED ORDER — TAMSULOSIN HCL 0.4 MG PO CAPS: ORAL_CAPSULE | ORAL | 3 refills | Status: AC

## 2016-07-16 NOTE — Progress Notes (Signed)
Charles Strickland  MRN: [id number] DOB: October 01, 1920  Subjective:  HPI  Patient is here for 6 months follow up.  Patient saw Ronne Binning earlier today. B/P: patient is not checking his b/p. No cardiac symptoms. BP Readings from Last 3 Encounters:  07/16/16 (!) 136/58  07/16/16 (!) 136/58  10/28/15 (!) 122/58   Last lab work was donee in August 2017 and it was stable. He is using Dulera inhaler twice daily. Patient Active Problem List   Diagnosis Date Noted  . CCF (congestive cardiac failure) (HCC) 01/15/2015  . CAFL (chronic airflow limitation) (HCC) 01/15/2015  . Borderline diabetes 01/15/2015  . Heart valve disease 01/15/2015  . HLD (hyperlipidemia) 01/15/2015  . BP (high blood pressure) 01/15/2015  . Hyperthyroidism 01/15/2015  . Adult hypothyroidism 01/15/2015  . Arthritis, degenerative 01/15/2015  . Closed fracture of sternum 01/15/2015  . Carotid artery occlusion 11/09/2013  . Benign essential HTN 07/07/2013  . Aortic valve defect 07/07/2013    No past medical history on file.  Social History   Social History  . Marital status: Married    Spouse name: N/A  . Number of children: N/A  . Years of education: N/A   Occupational History  . Not on file.   Social History Main Topics  . Smoking status: Former Smoker    Packs/day: 3.00    Types: Cigarettes  . Smokeless tobacco: Former Neurosurgeon    Types: Snuff, Chew     Comment: quit 40 years ago  . Alcohol use No  . Drug use: No  . Sexual activity: Not on file   Other Topics Concern  . Not on file   Social History Narrative  . No narrative on file    Outpatient Encounter Prescriptions as of 07/16/2016  Medication Sig Note  . albuterol (VENTOLIN HFA) 108 (90 BASE) MCG/ACT inhaler Inhale into the lungs. 01/15/2015: Medication taken as needed.  Received from: [facility]  . amLODipine (NORVASC) 5 MG tablet Take 1 tablet (5 mg total) by mouth daily.   Marland Kitchen aspirin 81 MG tablet Take by mouth.  01/15/2015: Received from: [facility]  . benazepril (LOTENSIN) 40 MG tablet Take by mouth. 01/15/2015: Received from: [facility]  . loratadine (CLARITIN) 10 MG tablet Take 10 mg by mouth daily.   Marland Kitchen losartan-hydrochlorothiazide (HYZAAR) 100-25 MG tablet Take by mouth. 01/15/2015: Received from: [facility]  . Methylcobalamin (B12-ACTIVE) 1 MG CHEW Chew by mouth. 01/15/2015: Received from: [facility]  . metoprolol (TOPROL-XL) 50 MG 24 hr tablet Take 1 tablet (50 mg total) by mouth daily. (Patient taking differently: Take 50 mg by mouth 2 (two) times daily. )   . Misc Natural Products (NARCOSOFT HERBAL LAX PO) Take by mouth.   . Mometasone Furo-Formoterol Fum (DULERA) 200-5 MCG/ACT AERO Inhale 2 puffs into the lungs 2 (two) times daily.   . Multiple Vitamin tablet Take by mouth. 01/15/2015: Received from: [facility]  . pravastatin (PRAVACHOL) 20 MG tablet Take 1 tablet (20 mg total) by mouth daily.   . Sennosides 25 MG TABS Take by mouth. 01/15/2015: Received from: [facility]  . tamsulosin (FLOMAX) 0.4 MG CAPS capsule Take 2 tablets daily   . vitamin B-12 (CYANOCOBALAMIN) 500 MCG tablet Take 500 mcg by mouth daily.   . [DISCONTINUED] furosemide (LASIX) 80 MG tablet Take 1 tablet (80 mg total) by mouth daily. (Patient not taking: Reported on 10/28/2015)   . [DISCONTINUED] potassium chloride (KLOR-CON) 20 MEQ packet Take 20 mEq  by mouth daily. (Patient not taking: Reported on 10/28/2015)    No facility-administered encounter medications on file as of 07/16/2016.     Allergies  Allergen Reactions  . Sulfa Antibiotics Rash    Review of Systems  Constitutional: Negative.   HENT: Positive for hearing loss.        Sneezing  Eyes: Negative.   Respiratory: Positive for shortness of breath (stable on inhaler use).   Cardiovascular: Negative.   Gastrointestinal: Negative.   Musculoskeletal:  Negative.        Using a cane to ambulate to help with steadiness  Neurological: Positive for dizziness (sometimes in the morning).  Endo/Heme/Allergies: Negative.   Psychiatric/Behavioral: Negative.     Objective:  BP (!) 136/58   Pulse 72   Temp 97.6 F (36.4 C)   Resp 16   Wt 170 lb (77.1 kg)   BMI 25.85 kg/m   Physical Exam  Constitutional: He is oriented to person, place, and time and well-developed, well-nourished, and in no distress.  HENT:  Head: Normocephalic and atraumatic.  Right Ear: External ear normal.  Left Ear: External ear normal.  Nose: Nose normal.  Eyes: Conjunctivae are normal. Pupils are equal, round, and reactive to light.  Neck: Normal range of motion. Neck supple.  Cardiovascular: Normal rate, regular rhythm and intact distal pulses.   Murmur heard.  Systolic murmur is present with a grade of 2/6  Upper right sternum border  Pulmonary/Chest: Effort normal and breath sounds normal. No respiratory distress. He has no wheezes.  Abdominal: Soft.  Musculoskeletal:  Using a cane to ambulate.  Neurological: He is alert and oriented to person, place, and time. GCS score is 15.  Skin: Skin is warm and dry.  Psychiatric: Mood, memory, affect and judgment normal.   Assessment and Plan :  1. COPD, mild (HCC) Stable. We do not have any samples of inhalers for the patient at this time. Breo, Advair and Symbicort are similar to the [facility] patient is using and advised patient and his daughter to check on the prices of the other inhalers to see if price will be cheaper and we can change then. - TSH  2. Benign essential HTN Stable. - CBC w/Diff/Platelet - Comprehensive metabolic panel  3. Other hyperlipidemia Check labs today. - Comprehensive metabolic panel - Lipid Panel With LDL/HDL Ratio - TSH  4. Benign prostatic hyperplasia without lower urinary tract symptoms Refill given. - tamsulosin (FLOMAX) 0.4 MG CAPS capsule; Take 2 tablets daily  Dispense:  180 capsule; Refill: 3 - TSH  5. Hyperglycemia - HgB A1c 6.s/p Aortic valve replacement 7.CAD/s/p CABG HPI, Exam and A&P transcribed by Samara Deist, RMA under direction and in the presence of Julieanne Manson, MD. I have done the exam and reviewed the chart and it is accurate to the best of my knowledge. Dentist has been used and  any errors in dictation or transcription are unintentional. Julieanne Manson M.D. [facility]

## 2016-07-16 NOTE — Patient Instructions (Signed)
Check on the price of Breo, Advair and Symbicort inhalers at the pharmacy or call insurance and if one of these are cheaper then [facility] inhaler we can switch it then.  Have a great evening.

## 2016-07-16 NOTE — Telephone Encounter (Signed)
Fax from [place] pharmacy in regards to [facility]: note said NDC not covered cost will be $237. Please review-aa

## 2016-07-16 NOTE — Progress Notes (Signed)
Subjective:   Charles Strickland is a 83 y.o. male who presents for Medicare Annual/Subsequent preventive examination.  Review of Systems:  N/A Cardiac Risk Factors include: advanced age (>65men, >35 women);dyslipidemia;hypertension;male gender     Objective:    Vitals: BP (!) 136/58 (BP Location: Right Arm)   Pulse 72   Temp 97.4 F (36.3 C) (Oral)   Ht  (1.727 m)   Wt 170 lb 9.6 oz (77.4 kg)   BMI 25.94 kg/m   Body mass index is 25.94 kg/m.  Tobacco History  Smoking Status  . Former Smoker  . Packs/day: 3.00  . Types: Cigarettes  Smokeless Tobacco  . Former Neurosurgeon  . Types: Snuff, Chew    Comment: quit 40 years ago     Counseling given: Not Answered   History reviewed. No pertinent past medical history. Past Surgical History:  Procedure Laterality Date  . HERNIA REPAIR  03/12/09   right   History reviewed. No pertinent family history. History  Sexual Activity  . Sexual activity: Not on file    Outpatient Encounter Prescriptions as of 07/16/2016  Medication Sig  . albuterol (VENTOLIN HFA) 108 (90 BASE) MCG/ACT inhaler Inhale into the lungs.  Marland Kitchen amLODipine (NORVASC) 5 MG tablet Take 1 tablet (5 mg total) by mouth daily.  Marland Kitchen aspirin 81 MG tablet Take by mouth.  . benazepril (LOTENSIN) 40 MG tablet Take by mouth.  . loratadine (CLARITIN) 10 MG tablet Take 10 mg by mouth daily.  Marland Kitchen losartan-hydrochlorothiazide (HYZAAR) 100-25 MG tablet Take by mouth.  . metoprolol (TOPROL-XL) 50 MG 24 hr tablet Take 1 tablet (50 mg total) by mouth daily. (Patient taking differently: Take 50 mg by mouth 2 (two) times daily. )  . Misc Natural Products (NARCOSOFT HERBAL LAX PO) Take by mouth.  . Mometasone Furo-Formoterol Fum (DULERA) 200-5 MCG/ACT AERO Inhale 2 puffs into the lungs 2 (two) times daily.  . Multiple Vitamin tablet Take by mouth.  . pravastatin (PRAVACHOL) 20 MG tablet Take 1 tablet (20 mg total) by mouth daily.  . Sennosides 25 MG TABS Take by mouth.  . tamsulosin  (FLOMAX) 0.4 MG CAPS capsule Take 2 tablets daily  . vitamin B-12 (CYANOCOBALAMIN) 500 MCG tablet Take 500 mcg by mouth daily.  . furosemide (LASIX) 80 MG tablet Take 1 tablet (80 mg total) by mouth daily. (Patient not taking: Reported on 10/28/2015)  . Methylcobalamin (B12-ACTIVE) 1 MG CHEW Chew by mouth.  . potassium chloride (KLOR-CON) 20 MEQ packet Take 20 mEq by mouth daily. (Patient not taking: Reported on 10/28/2015)   No facility-administered encounter medications on file as of 07/16/2016.     Activities of Daily Living In your present state of health, do you have any difficulty performing the following activities: 07/16/2016 07/17/2015  Hearing? Malvin Johns  Vision? N N  Difficulty concentrating or making decisions? N Y  Walking or climbing stairs? N N  Dressing or bathing? N N  Doing errands, shopping? N Y  Quarry manager and eating ? N -  Using the Toilet? N -  In the past six months, have you accidently leaked urine? N -  Do you have problems with loss of bowel control? N -  Managing your Medications? N -  Managing your Finances? N -  Housekeeping or managing your Housekeeping? N -  Some recent data might be hidden    Patient Care Team: Maple Hudson., MD as PCP - General (Family Medicine)   Assessment:  Exercise Activities and Dietary recommendations Current Exercise Habits: The patient does not participate in regular exercise at present, Exercise limited by: None identified  Goals    . Exercise          Recommend walking 3 days a week for 20 minutes.       Fall Risk Fall Risk  07/16/2016 07/17/2015 01/16/2015  Falls in the past year? Yes No No  Number falls in past yr: 2 or more - -  Injury with Fall? No - -  Follow up Falls prevention discussed - -   Depression Screen PHQ 2/9 Scores 07/16/2016 07/17/2015 01/16/2015  PHQ - 2 Score 0 0 0    Cognitive Function        Immunization History  Administered Date(s) Administered  . Influenza, High Dose  Seasonal PF 01/16/2015  . Pneumococcal Conjugate-13 01/16/2015  . Pneumococcal Polysaccharide-23 02/07/2013  . Td 06/08/2007   Screening Tests Health Maintenance  Topic Date Due  . INFLUENZA VACCINE  10/28/2016  . TETANUS/TDAP  06/07/2017  . PNA vac Low Risk Adult  Completed      Plan:  I have personally reviewed and addressed the Medicare Annual Wellness questionnaire and have noted the following in the patient's chart:  A. Medical and social history B. Use of alcohol, tobacco or illicit drugs  C. Current medications and supplements D. Functional ability and status E.  Nutritional status F.  Physical activity G. Advance directives H. List of other physicians I.  Hospitalizations, surgeries, and ER visits in previous 12 months J.  Vitals K. Screenings such as hearing and vision if needed, cognitive and depression L. Referrals and appointments - none  In addition, I have reviewed and discussed with patient certain preventive protocols, quality metrics, and best practice recommendations. A written personalized care plan for preventive services as well as general preventive health recommendations were provided to patient.  See attached scanned questionnaire for additional information.   Signed,  Hyacinth Meeker, LPN Nurse Health Advisor   MD Recommendations: None I have reviewed the health advisors note, was  available for consultation and I agree with documentation and plan. Julieanne Manson MD [facility]

## 2016-07-16 NOTE — Patient Instructions (Signed)
[name] , Thank you for taking time to come for your Medicare Wellness Visit. I appreciate your ongoing commitment to your health goals. Please review the following plan we discussed and let me know if I can assist you in the future.   Screening recommendations/referrals: Colonoscopy: last done 04/23/01 Recommended yearly ophthalmology/optometry visit for glaucoma screening and checkup Recommended yearly dental visit for hygiene and checkup  Vaccinations: Influenza vaccine: done 12/19/15 Pneumococcal vaccine: completed series Tdap vaccine: last done 06/08/07 Shingles vaccine: declined    Advanced directives: requested copy  Conditions/risks identified: fall risk prevention  Next appointment: None  Preventive Care 65 Years and Older, Male Preventive care refers to lifestyle choices and visits with your health care provider that can promote health and wellness. What does preventive care include?  A yearly physical exam. This is also called an annual well check.  Dental exams once or twice a year.  Routine eye exams. Ask your health care provider how often you should have your eyes checked.  Personal lifestyle choices, including:  Daily care of your teeth and gums.  Regular physical activity.  Eating a healthy diet.  Avoiding tobacco and drug use.  Limiting alcohol use.  Practicing safe sex.  Taking low doses of aspirin every day.  Taking vitamin and mineral supplements as recommended by your health care provider. What happens during an annual well check? The services and screenings done by your health care provider during your annual well check will depend on your age, overall health, lifestyle risk factors, and family history of disease. Counseling  Your health care provider may ask you questions about your:  Alcohol use.  Tobacco use.  Drug use.  Emotional well-being.  Home and relationship well-being.  Sexual activity.  Eating habits.  History of  falls.  Memory and ability to understand (cognition).  Work and work Astronomer. Screening  You may have the following tests or measurements:  Height, weight, and BMI.  Blood pressure.  Lipid and cholesterol levels. These may be checked every 5 years, or more frequently if you are over 106 years old.  Skin check.  Lung cancer screening. You may have this screening every year starting at age 42 if you have a 30-pack-year history of smoking and currently smoke or have quit within the past 15 years.  Fecal occult blood test (FOBT) of the stool. You may have this test every year starting at age 70.  Flexible sigmoidoscopy or colonoscopy. You may have a sigmoidoscopy every 5 years or a colonoscopy every 10 years starting at age 28.  Prostate cancer screening. Recommendations will vary depending on your family history and other risks.  Hepatitis C blood test.  Hepatitis B blood test.  Sexually transmitted disease (STD) testing.  Diabetes screening. This is done by checking your blood sugar (glucose) after you have not eaten for a while (fasting). You may have this done every 1-3 years.  Abdominal aortic aneurysm (AAA) screening. You may need this if you are a current or former smoker.  Osteoporosis. You may be screened starting at age 73 if you are at high risk. Talk with your health care provider about your test results, treatment options, and if necessary, the need for more tests. Vaccines  Your health care provider may recommend certain vaccines, such as:  Influenza vaccine. This is recommended every year.  Tetanus, diphtheria, and acellular pertussis (Tdap, Td) vaccine. You may need a Td booster every 10 years.  Zoster vaccine. You may need this after age  60.  Pneumococcal 13-valent conjugate (PCV13) vaccine. One dose is recommended after age 31.  Pneumococcal polysaccharide (PPSV23) vaccine. One dose is recommended after age 81. Talk to your health care provider about  which screenings and vaccines you need and how often you need them. This information is not intended to replace advice given to you by your health care provider. Make sure you discuss any questions you have with your health care provider. Document Released: 04/12/2015 Document Revised: 12/04/2015 Document Reviewed: 01/15/2015 Elsevier Interactive Patient Education  2017 [place] Prevention in the Home Falls can cause injuries. They can happen to people of all ages. There are many things you can do to make your home safe and to help prevent falls. What can I do on the outside of my home?  Regularly fix the edges of walkways and driveways and fix any cracks.  Remove anything that might make you trip as you walk through a door, such as a raised step or threshold.  Trim any bushes or trees on the path to your home.  Use bright outdoor lighting.  Clear any walking paths of anything that might make someone trip, such as rocks or tools.  Regularly check to see if handrails are loose or broken. Make sure that both sides of any steps have handrails.  Any raised decks and porches should have guardrails on the edges.  Have any leaves, snow, or ice cleared regularly.  Use sand or salt on walking paths during winter.  Clean up any spills in your garage right away. This includes oil or grease spills. What can I do in the bathroom?  Use night lights.  Install grab bars by the toilet and in the tub and shower. Do not use towel bars as grab bars.  Use non-skid mats or decals in the tub or shower.  If you need to sit down in the shower, use a plastic, non-slip stool.  Keep the floor dry. Clean up any water that spills on the floor as soon as it happens.  Remove soap buildup in the tub or shower regularly.  Attach bath mats securely with double-sided non-slip rug tape.  Do not have throw rugs and other things on the floor that can make you trip. What can I do in the  bedroom?  Use night lights.  Make sure that you have a light by your bed that is easy to reach.  Do not use any sheets or blankets that are too big for your bed. They should not hang down onto the floor.  Have a firm chair that has side arms. You can use this for support while you get dressed.  Do not have throw rugs and other things on the floor that can make you trip. What can I do in the kitchen?  Clean up any spills right away.  Avoid walking on wet floors.  Keep items that you use a lot in easy-to-reach places.  If you need to reach something above you, use a strong step stool that has a grab bar.  Keep electrical cords out of the way.  Do not use floor polish or wax that makes floors slippery. If you must use wax, use non-skid floor wax.  Do not have throw rugs and other things on the floor that can make you trip. What can I do with my stairs?  Do not leave any items on the stairs.  Make sure that there are handrails on both sides of the stairs and  use them. Fix handrails that are broken or loose. Make sure that handrails are as long as the stairways.  Check any carpeting to make sure that it is firmly attached to the stairs. Fix any carpet that is loose or worn.  Avoid having throw rugs at the top or bottom of the stairs. If you do have throw rugs, attach them to the floor with carpet tape.  Make sure that you have a light switch at the top of the stairs and the bottom of the stairs. If you do not have them, ask someone to add them for you. What else can I do to help prevent falls?  Wear shoes that:  Do not have high heels.  Have rubber bottoms.  Are comfortable and fit you well.  Are closed at the toe. Do not wear sandals.  If you use a stepladder:  Make sure that it is fully opened. Do not climb a closed stepladder.  Make sure that both sides of the stepladder are locked into place.  Ask someone to hold it for you, if possible.  Clearly mark and make  sure that you can see:  Any grab bars or handrails.  First and last steps.  Where the edge of each step is.  Use tools that help you move around (mobility aids) if they are needed. These include:  Canes.  Walkers.  Scooters.  Crutches.  Turn on the lights when you go into a dark area. Replace any light bulbs as soon as they burn out.  Set up your furniture so you have a clear path. Avoid moving your furniture around.  If any of your floors are uneven, fix them.  If there are any pets around you, be aware of where they are.  Review your medicines with your doctor. Some medicines can make you feel dizzy. This can increase your chance of falling. Ask your doctor what other things that you can do to help prevent falls. This information is not intended to replace advice given to you by your health care provider. Make sure you discuss any questions you have with your health care provider. Document Released: 01/10/2009 Document Revised: 08/22/2015 Document Reviewed: 04/20/2014 Elsevier Interactive Patient Education  2017 [employer].

## 2016-07-21 LAB — CBC WITH DIFFERENTIAL/PLATELET
Basophils Absolute: 0 10*3/uL (ref 0.0–0.2)
Basos: 0 %
EOS (ABSOLUTE): 0.4 10*3/uL (ref 0.0–0.4)
EOS: 5 %
HEMATOCRIT: 37.1 % — AB (ref 37.5–51.0)
HEMOGLOBIN: 12.3 g/dL — AB (ref 13.0–17.7)
Immature Grans (Abs): 0 10*3/uL (ref 0.0–0.1)
Immature Granulocytes: 0 %
LYMPHS ABS: 1.6 10*3/uL (ref 0.7–3.1)
Lymphs: 20 %
MCH: 28.3 pg (ref 26.6–33.0)
MCHC: 33.2 g/dL (ref 31.5–35.7)
MCV: 85 fL (ref 79–97)
MONOCYTES: 7 %
MONOS ABS: 0.6 10*3/uL (ref 0.1–0.9)
NEUTROS ABS: 5.2 10*3/uL (ref 1.4–7.0)
Neutrophils: 68 %
Platelets: 166 10*3/uL (ref 150–379)
RBC: 4.35 x10E6/uL (ref 4.14–5.80)
RDW: 14.4 % (ref 12.3–15.4)
WBC: 7.7 10*3/uL (ref 3.4–10.8)

## 2016-07-21 LAB — COMPREHENSIVE METABOLIC PANEL
A/G RATIO: 1.6 (ref 1.2–2.2)
ALBUMIN: 3.8 g/dL (ref 3.2–4.6)
ALK PHOS: 74 IU/L (ref 39–117)
ALT: 13 IU/L (ref 0–44)
AST: 20 IU/L (ref 0–40)
BILIRUBIN TOTAL: 0.2 mg/dL (ref 0.0–1.2)
BUN / CREAT RATIO: 16 (ref 10–24)
BUN: 15 mg/dL (ref 10–36)
CO2: 27 mmol/L (ref 18–29)
Calcium: 9.9 mg/dL (ref 8.6–10.2)
Chloride: 102 mmol/L (ref 96–106)
Creatinine, Ser: 0.96 mg/dL (ref 0.76–1.27)
GFR calc non Af Amer: 68 mL/min/{1.73_m2} (ref >59)
GFR, EST AFRICAN AMERICAN: 78 mL/min/{1.73_m2} (ref >59)
GLOBULIN, TOTAL: 2.4 g/dL (ref 1.5–4.5)
Glucose: 102 mg/dL — ABNORMAL HIGH (ref 65–99)
Potassium: 4.1 mmol/L (ref 3.5–5.2)
Sodium: 140 mmol/L (ref 134–144)
Total Protein: 6.2 g/dL (ref 6.0–8.5)

## 2016-07-21 LAB — LIPID PANEL WITH LDL/HDL RATIO
CHOLESTEROL TOTAL: 136 mg/dL (ref 100–199)
HDL: 30 mg/dL — AB (ref >39)
LDL CALC: 81 mg/dL (ref 0–99)
LDL/HDL RATIO: 2.7 ratio (ref 0.0–3.6)
TRIGLYCERIDES: 123 mg/dL (ref 0–149)
VLDL Cholesterol Cal: 25 mg/dL (ref 5–40)

## 2016-07-21 LAB — HEMOGLOBIN A1C
Est. average glucose Bld gHb Est-mCnc: 117 mg/dL
HEMOGLOBIN A1C: 5.7 % — AB (ref 4.8–5.6)

## 2016-07-21 LAB — TSH: TSH: 4.19 u[IU]/mL (ref 0.450–4.500)

## 2016-07-23 NOTE — Progress Notes (Signed)
Advised  ED 

## 2016-07-24 NOTE — Telephone Encounter (Signed)
Yes, both doses-aa

## 2016-07-24 NOTE — Telephone Encounter (Signed)
Do we have symbicort samples.?

## 2016-08-28 NOTE — Telephone Encounter (Signed)
Ok to give him samples.   

## 2016-08-31 NOTE — Telephone Encounter (Signed)
lmtcb with daughter Dois DavenportSandra, will discuss this further with her-aa

## 2016-09-01 NOTE — Telephone Encounter (Signed)
See below[facility] get [facility]er that was not covered and what is he [place]-aa

## 2016-09-12 NOTE — Telephone Encounter (Signed)
Higher dose ok

## 2017-01-20 ENCOUNTER — Ambulatory Visit: Payer: Self-pay | Admitting: Family Medicine

## 2017-01-21 ENCOUNTER — Ambulatory Visit (INDEPENDENT_AMBULATORY_CARE_PROVIDER_SITE_OTHER): Payer: Medicare Other | Admitting: Family Medicine

## 2017-01-21 ENCOUNTER — Ambulatory Visit: Payer: Medicare Other | Admitting: Family Medicine

## 2017-01-21 VITALS — BP 112/52 | HR 62 | Temp 97.6°F | Resp 16 | Wt 165.0 lb

## 2017-01-21 DIAGNOSIS — E7849 Other hyperlipidemia: Secondary | ICD-10-CM

## 2017-01-21 DIAGNOSIS — I10 Essential (primary) hypertension: Secondary | ICD-10-CM

## 2017-01-21 DIAGNOSIS — J449 Chronic obstructive pulmonary disease, unspecified: Secondary | ICD-10-CM | POA: Diagnosis not present

## 2017-01-21 NOTE — Progress Notes (Signed)
Charles Strickland  MRN: [id number] DOB: Apr 28, 1920  Subjective:  HPI  Patient is here for 6 months follow up. Last office visit was on 07/13/16. Routine lab work was done on 07/20/16 and it was ok. HTN: patient is not really checking his b/p since it has been so consistent. No cardiac symptoms present.  BP Readings from Last 3 Encounters:  01/21/17 (!) 112/52  07/16/16 (!) 136/58  07/16/16 (!) 136/58   Wt Readings from Last 3 Encounters:  01/21/17 165 lb (74.8 kg)  07/16/16 170 lb (77.1 kg)  07/16/16 170 lb 9.6 oz (77.4 kg)   Hyperlipidemia: patient is taking Pravastatin daily. Lab Results  Component Value Date   CHOL 136 07/20/2016   HDL 30 (L) 07/20/2016   LDLCALC 81 07/20/2016   TRIG 123 07/20/2016    Patient Active Problem List   Diagnosis Date Noted  . CCF (congestive cardiac failure) (HCC) 01/15/2015  . CAFL (chronic airflow limitation) (HCC) 01/15/2015  . Borderline diabetes 01/15/2015  . Heart valve disease 01/15/2015  . HLD (hyperlipidemia) 01/15/2015  . BP (high blood pressure) 01/15/2015  . Hyperthyroidism 01/15/2015  . Adult hypothyroidism 01/15/2015  . Arthritis, degenerative 01/15/2015  . Closed fracture of sternum 01/15/2015  . Carotid artery occlusion 11/09/2013  . Benign essential HTN 07/07/2013  . Aortic valve defect 07/07/2013    No past medical history on file.  Social History   Social History  . Marital status: Married    Spouse name: N/A  . Number of children: N/A  . Years of education: N/A   Occupational History  . Not on file.   Social History Main Topics  . Smoking status: Former Smoker    Packs/day: 3.00    Types: Cigarettes  . Smokeless tobacco: Former Neurosurgeon    Types: Snuff, Chew     Comment: quit 40 years ago  . Alcohol use No  . Drug use: No  . Sexual activity: Not on file   Other Topics Concern  . Not on file   Social History Narrative  . No narrative on file    Outpatient Encounter Prescriptions as of 01/21/2017    Medication Sig Note  . albuterol (VENTOLIN HFA) 108 (90 BASE) MCG/ACT inhaler Inhale into the lungs. 01/15/2015: Medication taken as needed.  Received from: [facility]  . amLODipine (NORVASC) 5 MG tablet Take 1 tablet (5 mg total) by mouth daily.   Marland Kitchen aspirin 81 MG tablet Take by mouth. 01/15/2015: Received from: [facility]  . benazepril (LOTENSIN) 40 MG tablet Take by mouth. 01/15/2015: Received from: [facility]  . cholecalciferol (VITAMIN D) 1000 units tablet Take 1,000 Units by mouth daily.   Marland Kitchen FLUAD 0.5 ML SUSY ADM 0.5ML IM UTD   . loratadine (CLARITIN) 10 MG tablet Take 10 mg by mouth daily.   Marland Kitchen losartan-hydrochlorothiazide (HYZAAR) 100-25 MG tablet Take by mouth. 01/15/2015: Received from: [facility]  . metoprolol (TOPROL-XL) 50 MG 24 hr tablet Take 1 tablet (50 mg total) by mouth daily. (Patient taking differently: Take 50 mg by mouth 2 (two) times daily. )   . mometasone-formoterol (DULERA) 200-5 MCG/ACT AERO Inhale 2 puffs into the lungs 2 (two) times daily.   . Multiple Vitamin tablet Take by mouth. 01/15/2015: Received from: [facility]  . pravastatin (PRAVACHOL) 20 MG tablet Take 1 tablet (20 mg total) by mouth daily.   . tamsulosin (FLOMAX) 0.4 MG CAPS capsule Take 2 tablets daily   . vitamin B-12 (CYANOCOBALAMIN)  500 MCG tablet Take 500 mcg by mouth daily.   . [DISCONTINUED] Methylcobalamin (B12-ACTIVE) 1 MG CHEW Chew by mouth. 01/15/2015: [facility]  . Misc Natural Products (NARCOSOFT HERBAL LAX PO) Take by mouth.   . Sennosides 25 MG TABS Take by mouth. 01/15/2015: [facility]   No facility-administered encounter medications on file as of 01/21/2017.     Allergies  Allergen Reactions  . Sulfa Antibiotics Rash    Review of Systems  Constitutional: Positive for malaise/fatigue.  Eyes: Negative.   Respiratory:  Negative.        Stable.  Cardiovascular: Negative.   Gastrointestinal: Positive for constipation (off and on) and diarrhea (off and on).  Musculoskeletal: Negative.   Skin: Negative.   Neurological: Positive for weakness. Negative for dizziness, tingling and headaches.  Endo/Heme/Allergies: Negative.   Psychiatric/Behavioral: Negative.     Objective:  BP (!) 112/52   Pulse 62   Temp 97.6 F (36.4 C)   Resp 16   Wt 165 lb (74.8 kg)   BMI 25.09 kg/m   Physical Exam  Constitutional: He is oriented to person, place, and time and well-developed, well-nourished, and in no distress.  HENT:  Head: Normocephalic and atraumatic.  Right Ear: External ear normal.  Left Ear: External ear normal.  Nose: Nose normal.  Eyes: Pupils are equal, round, and reactive to light. Conjunctivae are normal. No scleral icterus.  Neck: No thyromegaly present.  Cardiovascular: Normal rate, regular rhythm, normal heart sounds and intact distal pulses.  Exam reveals no gallop.   No murmur heard. Pulmonary/Chest: Effort normal and breath sounds normal. No respiratory distress. He has no wheezes.  Abdominal: Soft.  Neurological: He is alert and oriented to person, place, and time. Gait normal. GCS score is 15.  Using a cane  Skin: Skin is warm and dry.  Multiple SKs.  Psychiatric: Mood, memory, affect and judgment normal.    Assessment and Plan :  1. Benign essential HTN Stable. Continue current regimen.  2. COPD, mild (HCC) Stable.  3. Other hyperlipidemia Stable on last check. Will check levels on the next visit 4.ASCVD  5.s/p Aortic valve replacement  HPI, Exam and A&P transcribed by Domingo CockingAnastasiya Hopkins, RMA under direction and in the presence of Julieanne Mansonichard Gilbert, MD. I have done the exam and reviewed the chart and it is accurate to the best of my knowledge. [employer]  technology has been used and  any errors in dictation or transcription are unintentional. Julieanne Mansonichard Gilbert M.D. [facility]

## 2017-02-13 ENCOUNTER — Other Ambulatory Visit: Payer: Self-pay

## 2017-02-13 ENCOUNTER — Emergency Department (HOSPITAL_COMMUNITY): Payer: Medicare Other

## 2017-02-13 ENCOUNTER — Inpatient Hospital Stay (HOSPITAL_COMMUNITY)
Admission: EM | Admit: 2017-02-13 | Discharge: 2017-02-16 | DRG: 092 | Disposition: A | Discharge status: Home Health | Payer: Medicare Other | Attending: Family Medicine | Admitting: Family Medicine

## 2017-02-13 ENCOUNTER — Encounter (HOSPITAL_COMMUNITY): Payer: Self-pay

## 2017-02-13 DIAGNOSIS — R7303 Prediabetes: Secondary | ICD-10-CM | POA: Diagnosis present

## 2017-02-13 DIAGNOSIS — Y831 Surgical operation with implant of artificial internal device as the cause of abnormal reaction of the patient, or of later complication, without mention of misadventure at the time of the procedure: Secondary | ICD-10-CM | POA: Diagnosis present

## 2017-02-13 DIAGNOSIS — W101XXA Fall (on)(from) sidewalk curb, initial encounter: Secondary | ICD-10-CM | POA: Diagnosis present

## 2017-02-13 DIAGNOSIS — S0003XD Contusion of scalp, subsequent encounter: Secondary | ICD-10-CM | POA: Diagnosis not present

## 2017-02-13 DIAGNOSIS — T82857A Stenosis of cardiac prosthetic devices, implants and grafts, initial encounter: Secondary | ICD-10-CM | POA: Diagnosis present

## 2017-02-13 DIAGNOSIS — S6992XA Unspecified injury of left wrist, hand and finger(s), initial encounter: Secondary | ICD-10-CM | POA: Diagnosis present

## 2017-02-13 DIAGNOSIS — M79642 Pain in left hand: Secondary | ICD-10-CM

## 2017-02-13 DIAGNOSIS — R0600 Dyspnea, unspecified: Secondary | ICD-10-CM

## 2017-02-13 DIAGNOSIS — E876 Hypokalemia: Secondary | ICD-10-CM | POA: Diagnosis present

## 2017-02-13 DIAGNOSIS — E785 Hyperlipidemia, unspecified: Secondary | ICD-10-CM | POA: Diagnosis present

## 2017-02-13 DIAGNOSIS — R0609 Other forms of dyspnea: Secondary | ICD-10-CM

## 2017-02-13 DIAGNOSIS — M1990 Unspecified osteoarthritis, unspecified site: Secondary | ICD-10-CM | POA: Diagnosis present

## 2017-02-13 DIAGNOSIS — Z87891 Personal history of nicotine dependence: Secondary | ICD-10-CM

## 2017-02-13 DIAGNOSIS — Y92481 Parking lot as the place of occurrence of the external cause: Secondary | ICD-10-CM

## 2017-02-13 DIAGNOSIS — S0101XA Laceration without foreign body of scalp, initial encounter: Secondary | ICD-10-CM | POA: Diagnosis present

## 2017-02-13 DIAGNOSIS — S0093XA Contusion of unspecified part of head, initial encounter: Secondary | ICD-10-CM | POA: Diagnosis present

## 2017-02-13 DIAGNOSIS — I350 Nonrheumatic aortic (valve) stenosis: Secondary | ICD-10-CM | POA: Diagnosis present

## 2017-02-13 DIAGNOSIS — R296 Repeated falls: Secondary | ICD-10-CM | POA: Diagnosis present

## 2017-02-13 DIAGNOSIS — Z951 Presence of aortocoronary bypass graft: Secondary | ICD-10-CM | POA: Diagnosis not present

## 2017-02-13 DIAGNOSIS — R42 Dizziness and giddiness: Secondary | ICD-10-CM | POA: Diagnosis present

## 2017-02-13 DIAGNOSIS — R2689 Other abnormalities of gait and mobility: Secondary | ICD-10-CM | POA: Diagnosis present

## 2017-02-13 DIAGNOSIS — E039 Hypothyroidism, unspecified: Secondary | ICD-10-CM | POA: Diagnosis present

## 2017-02-13 DIAGNOSIS — Z953 Presence of xenogenic heart valve: Secondary | ICD-10-CM | POA: Diagnosis not present

## 2017-02-13 DIAGNOSIS — Z7982 Long term (current) use of aspirin: Secondary | ICD-10-CM | POA: Diagnosis not present

## 2017-02-13 DIAGNOSIS — I351 Nonrheumatic aortic (valve) insufficiency: Secondary | ICD-10-CM | POA: Diagnosis not present

## 2017-02-13 DIAGNOSIS — Z23 Encounter for immunization: Secondary | ICD-10-CM | POA: Diagnosis not present

## 2017-02-13 DIAGNOSIS — R55 Syncope and collapse: Secondary | ICD-10-CM | POA: Diagnosis present

## 2017-02-13 DIAGNOSIS — Z882 Allergy status to sulfonamides status: Secondary | ICD-10-CM | POA: Diagnosis not present

## 2017-02-13 DIAGNOSIS — H9190 Unspecified hearing loss, unspecified ear: Secondary | ICD-10-CM | POA: Diagnosis present

## 2017-02-13 DIAGNOSIS — M47812 Spondylosis without myelopathy or radiculopathy, cervical region: Secondary | ICD-10-CM | POA: Diagnosis present

## 2017-02-13 DIAGNOSIS — N179 Acute kidney failure, unspecified: Secondary | ICD-10-CM | POA: Diagnosis present

## 2017-02-13 DIAGNOSIS — I509 Heart failure, unspecified: Secondary | ICD-10-CM | POA: Diagnosis present

## 2017-02-13 DIAGNOSIS — I110 Hypertensive heart disease with heart failure: Secondary | ICD-10-CM | POA: Diagnosis present

## 2017-02-13 DIAGNOSIS — W19XXXD Unspecified fall, subsequent encounter: Secondary | ICD-10-CM | POA: Diagnosis not present

## 2017-02-13 DIAGNOSIS — W19XXXA Unspecified fall, initial encounter: Secondary | ICD-10-CM

## 2017-02-13 DIAGNOSIS — S0101XD Laceration without foreign body of scalp, subsequent encounter: Secondary | ICD-10-CM | POA: Diagnosis not present

## 2017-02-13 HISTORY — DX: Atherosclerotic heart disease of native coronary artery without angina pectoris: I25.10

## 2017-02-13 HISTORY — DX: Repeated falls: R29.6

## 2017-02-13 HISTORY — DX: Unspecified hearing loss, unspecified ear: H91.90

## 2017-02-13 HISTORY — DX: Hyperlipidemia, unspecified: E78.5

## 2017-02-13 HISTORY — DX: Unspecified injury of left wrist, hand and finger(s), initial encounter: S69.92XA

## 2017-02-13 HISTORY — DX: Other forms of dyspnea: R06.09

## 2017-02-13 HISTORY — DX: Stenosis of other cardiac prosthetic devices, implants and grafts, initial encounter: T82.857A

## 2017-02-13 HISTORY — DX: Essential (primary) hypertension: I10

## 2017-02-13 LAB — I-STAT CHEM 8, ED
BUN: 19 mg/dL (ref 6–20)
CALCIUM ION: 1.24 mmol/L (ref 1.15–1.40)
CHLORIDE: 102 mmol/L (ref 101–111)
Creatinine, Ser: 1.4 mg/dL — ABNORMAL HIGH (ref 0.61–1.24)
GLUCOSE: 111 mg/dL — AB (ref 65–99)
HCT: 32 % — ABNORMAL LOW (ref 39.0–52.0)
HEMOGLOBIN: 10.9 g/dL — AB (ref 13.0–17.0)
POTASSIUM: 3.1 mmol/L — AB (ref 3.5–5.1)
SODIUM: 140 mmol/L (ref 135–145)
TCO2: 25 mmol/L (ref 22–32)

## 2017-02-13 LAB — COMPREHENSIVE METABOLIC PANEL
ALT: 19 U/L (ref 17–63)
AST: 36 U/L (ref 15–41)
Albumin: 3.7 g/dL (ref 3.5–5.0)
Alkaline Phosphatase: 79 U/L (ref 38–126)
Anion gap: 7 (ref 5–15)
BUN: 18 mg/dL (ref 6–20)
CALCIUM: 9.6 mg/dL (ref 8.9–10.3)
CHLORIDE: 105 mmol/L (ref 101–111)
CO2: 26 mmol/L (ref 22–32)
CREATININE: 1.4 mg/dL — AB (ref 0.61–1.24)
GFR, EST AFRICAN AMERICAN: 48 mL/min — AB (ref >60)
GFR, EST NON AFRICAN AMERICAN: 42 mL/min — AB (ref >60)
Glucose, Bld: 111 mg/dL — ABNORMAL HIGH (ref 65–99)
Potassium: 3.1 mmol/L — ABNORMAL LOW (ref 3.5–5.1)
Sodium: 138 mmol/L (ref 135–145)
Total Bilirubin: 0.6 mg/dL (ref 0.3–1.2)
Total Protein: 6.5 g/dL (ref 6.5–8.1)

## 2017-02-13 LAB — CBC
HEMATOCRIT: 31.8 % — AB (ref 39.0–52.0)
Hemoglobin: 11.2 g/dL — ABNORMAL LOW (ref 13.0–17.0)
MCH: 29.7 pg (ref 26.0–34.0)
MCHC: 35.2 g/dL (ref 30.0–36.0)
MCV: 84.4 fL (ref 78.0–100.0)
Platelets: 188 10*3/uL (ref 150–400)
RBC: 3.77 MIL/uL — ABNORMAL LOW (ref 4.22–5.81)
RDW: 13.5 % (ref 11.5–15.5)
WBC: 10.9 10*3/uL — ABNORMAL HIGH (ref 4.0–10.5)

## 2017-02-13 LAB — DIFFERENTIAL
BASOS PCT: 1 %
Basophils Absolute: 0.1 10*3/uL (ref 0.0–0.1)
EOS ABS: 0.2 10*3/uL (ref 0.0–0.7)
Eosinophils Relative: 2 %
Lymphocytes Relative: 10 %
Lymphs Abs: 1 10*3/uL (ref 0.7–4.0)
MONO ABS: 0.6 10*3/uL (ref 0.1–1.0)
MONOS PCT: 5 %
NEUTROS ABS: 9.1 10*3/uL — AB (ref 1.7–7.7)
Neutrophils Relative %: 82 %

## 2017-02-13 LAB — CBG MONITORING, ED: Glucose-Capillary: 116 mg/dL — ABNORMAL HIGH (ref 65–99)

## 2017-02-13 LAB — SAMPLE TO BLOOD BANK

## 2017-02-13 LAB — URINALYSIS, ROUTINE W REFLEX MICROSCOPIC
BILIRUBIN URINE: NEGATIVE
GLUCOSE, UA: NEGATIVE mg/dL
HGB URINE DIPSTICK: NEGATIVE
KETONES UR: 5 mg/dL — AB
LEUKOCYTES UA: NEGATIVE
Nitrite: NEGATIVE
Protein, ur: NEGATIVE mg/dL
Specific Gravity, Urine: 1.01 (ref 1.005–1.030)
pH: 6 (ref 5.0–8.0)

## 2017-02-13 LAB — PROTIME-INR
INR: 1.03
Prothrombin Time: 13.4 seconds (ref 11.4–15.2)

## 2017-02-13 LAB — ETHANOL

## 2017-02-13 LAB — APTT: aPTT: 31 seconds (ref 24–36)

## 2017-02-13 LAB — I-STAT TROPONIN, ED: TROPONIN I, POC: 0 ng/mL (ref 0.00–0.08)

## 2017-02-13 MED ORDER — TETANUS-DIPHTH-ACELL PERTUSSIS 5-2.5-18.5 LF-MCG/0.5 IM SUSP
0.5000 mL | Freq: Once | INTRAMUSCULAR | Status: AC
  Administered 2017-02-14: 0.5 mL via INTRAMUSCULAR
  Filled 2017-02-13: qty 0.5

## 2017-02-13 MED ORDER — PRAVASTATIN SODIUM 20 MG PO TABS
20.0000 mg | ORAL_TABLET | Freq: Every day | ORAL | Status: DC
  Administered 2017-02-14 – 2017-02-15 (×3): 20 mg via ORAL
  Filled 2017-02-13 (×3): qty 1

## 2017-02-13 MED ORDER — ACETAMINOPHEN 650 MG RE SUPP: 650.0000 mg | Freq: Four times a day (QID) | RECTAL | Status: DC | PRN

## 2017-02-13 MED ORDER — AMLODIPINE BESYLATE 5 MG PO TABS
5.0000 mg | ORAL_TABLET | Freq: Every day | ORAL | Status: DC
  Administered 2017-02-14 – 2017-02-16 (×3): 5 mg via ORAL
  Filled 2017-02-13 (×3): qty 1

## 2017-02-13 MED ORDER — FLUTICASONE PROPIONATE 50 MCG/ACT NA SUSP: 1.0000 | Freq: Every day | NASAL | Status: DC | PRN

## 2017-02-13 MED ORDER — ACETAMINOPHEN 325 MG PO TABS: 650.0000 mg | ORAL_TABLET | Freq: Four times a day (QID) | ORAL | Status: DC | PRN

## 2017-02-13 MED ORDER — CEFAZOLIN SODIUM-DEXTROSE 2-4 GM/100ML-% IV SOLN
2.0000 g | Freq: Once | INTRAVENOUS | Status: AC
  Administered 2017-02-14: 2 g via INTRAVENOUS
  Filled 2017-02-13: qty 100

## 2017-02-13 MED ORDER — METOPROLOL TARTRATE 50 MG PO TABS
50.0000 mg | ORAL_TABLET | Freq: Two times a day (BID) | ORAL | Status: DC
  Administered 2017-02-14 – 2017-02-16 (×6): 50 mg via ORAL
  Filled 2017-02-13 (×6): qty 1

## 2017-02-13 MED ORDER — ALBUTEROL SULFATE (2.5 MG/3ML) 0.083% IN NEBU: 3.0000 mL | INHALATION_SOLUTION | Freq: Three times a day (TID) | RESPIRATORY_TRACT | Status: DC | PRN

## 2017-02-13 MED ORDER — POTASSIUM CHLORIDE CRYS ER 20 MEQ PO TBCR
40.0000 meq | EXTENDED_RELEASE_TABLET | Freq: Once | ORAL | Status: AC
  Administered 2017-02-14: 40 meq via ORAL
  Filled 2017-02-13: qty 2

## 2017-02-13 MED ORDER — OXYCODONE HCL 5 MG PO TABS: 5.0000 mg | ORAL_TABLET | Freq: Four times a day (QID) | ORAL | Status: DC | PRN

## 2017-02-13 MED ORDER — TAMSULOSIN HCL 0.4 MG PO CAPS
0.8000 mg | ORAL_CAPSULE | Freq: Every day | ORAL | Status: DC
  Administered 2017-02-14 – 2017-02-16 (×3): 0.8 mg via ORAL
  Filled 2017-02-13 (×3): qty 2

## 2017-02-13 MED ORDER — LIDOCAINE-EPINEPHRINE 1 %-1:100000 IJ SOLN
20.0000 mL | Freq: Once | INTRAMUSCULAR | Status: AC
  Administered 2017-02-13: 20 mL via INTRADERMAL
  Filled 2017-02-13: qty 20

## 2017-02-13 MED ORDER — ASPIRIN EC 81 MG PO TBEC
81.0000 mg | DELAYED_RELEASE_TABLET | Freq: Every day | ORAL | Status: DC
  Administered 2017-02-14 – 2017-02-16 (×3): 81 mg via ORAL
  Filled 2017-02-13 (×3): qty 1

## 2017-02-13 MED ORDER — POLYETHYLENE GLYCOL 3350 17 G PO PACK
17.0000 g | PACK | Freq: Every day | ORAL | Status: DC
  Administered 2017-02-14 – 2017-02-16 (×3): 17 g via ORAL
  Filled 2017-02-13 (×3): qty 1

## 2017-02-13 MED ORDER — CYANOCOBALAMIN 500 MCG PO TABS
500.0000 ug | ORAL_TABLET | Freq: Every day | ORAL | Status: DC
Start: 2017-02-14 — End: 2017-02-16
  Administered 2017-02-14 – 2017-02-16 (×3): 500 ug via ORAL
  Filled 2017-02-13 (×3): qty 1

## 2017-02-13 MED ORDER — VITAMIN D 1000 UNITS PO TABS
1000.0000 [IU] | ORAL_TABLET | Freq: Every day | ORAL | Status: DC
  Administered 2017-02-14 – 2017-02-16 (×3): 1000 [IU] via ORAL
  Filled 2017-02-13 (×3): qty 1

## 2017-02-13 MED ORDER — ENOXAPARIN SODIUM 40 MG/0.4ML ~~LOC~~ SOLN
40.0000 mg | SUBCUTANEOUS | Status: DC
  Administered 2017-02-14 – 2017-02-16 (×3): 40 mg via SUBCUTANEOUS
  Filled 2017-02-13 (×3): qty 0.4

## 2017-02-13 MED ORDER — MOMETASONE FURO-FORMOTEROL FUM 200-5 MCG/ACT IN AERO
2.0000 | INHALATION_SPRAY | Freq: Two times a day (BID) | RESPIRATORY_TRACT | Status: DC
  Administered 2017-02-14 – 2017-02-16 (×4): 2 via RESPIRATORY_TRACT
  Filled 2017-02-13: qty 8.8

## 2017-02-13 NOTE — ED Notes (Signed)
Attempted report x 1.  Left number  

## 2017-02-13 NOTE — ED Provider Notes (Signed)
[facility] EMERGENCY DEPARTMENT Provider Note   CSN: [id number] Arrival date & time:        History   Chief Complaint Chief Complaint  Patient presents with  . Fall  . Head Laceration    HPI Charles Strickland is a 83 y.o. male who presents to the ED with cc of fall. He is BIB EMS with large head laceration. The patient gives the medical hx. He states that last night he went to bed at 8 PM without any abnormalities.  When he awoke and got up he is a bathroom he noticed that he felt like he wanted to fall forward.  He states that he felt like that all day and was having to hold onto things.  He normally ambulates with a cane.  The patient states that he was at [facility] and bought some chicken nuggets for lunch.  He was trying to find his car in the parking lot and was having difficulty ambulating because of his disequilibrium.  He sat down on a curb and when he tried to get up he fell forward hitting his head on the edge of the curb and skinned his left knee.  He suffered a very large head laceration.  He denies loss of consciousness.  The patient states that he thinks he may have had a previous stroke because he has some weakness on the left side of his body which has been there for many years.  He states he has never been evaluated for it and it does not interfere with his activities of daily living.  He   HPI  Past Medical History:  Diagnosis Date  . Hard of hearing     Patient Active Problem List   Diagnosis Date Noted  . CCF (congestive cardiac failure) (HCC) 01/15/2015  . CAFL (chronic airflow limitation) (HCC) 01/15/2015  . Borderline diabetes 01/15/2015  . Heart valve disease 01/15/2015  . HLD (hyperlipidemia) 01/15/2015  . BP (high blood pressure) 01/15/2015  . Hyperthyroidism 01/15/2015  . Adult hypothyroidism 01/15/2015  . Arthritis, degenerative 01/15/2015  . Closed fracture of sternum 01/15/2015  . Carotid artery occlusion 11/09/2013  . Benign  essential HTN 07/07/2013  . Aortic valve defect 07/07/2013    Past Surgical History:  Procedure Laterality Date  . HERNIA REPAIR  03/12/09   right       Home Medications    Prior to Admission medications   Medication Sig Start Date End Date Taking? Authorizing Provider  albuterol (VENTOLIN HFA) 108 (90 BASE) MCG/ACT inhaler Inhale into the lungs. 05/17/12   [provider]  amLODipine (NORVASC) 5 MG tablet Take 1 tablet (5 mg total) by mouth daily. 03/18/11   Karie Schwalbe, MD  aspirin 81 MG tablet Take by mouth. 05/17/12   [provider]  benazepril (LOTENSIN) 40 MG tablet Take by mouth. 07/18/14   [provider]  cholecalciferol (VITAMIN D) 1000 units tablet Take 1,000 Units by mouth daily.    [provider]  FLUAD 0.5 ML SUSY ADM 0.5ML IM UTD 12/22/16   [provider]  loratadine (CLARITIN) 10 MG tablet Take 10 mg by mouth daily.    [provider]  losartan-hydrochlorothiazide (HYZAAR) 100-25 MG tablet Take by mouth. 05/17/12   [provider]  metoprolol (TOPROL-XL) 50 MG 24 hr tablet Take 1 tablet (50 mg total) by mouth daily. Patient taking differently: Take 50 mg by mouth 2 (two) times daily.  03/18/11   Karie Schwalbe, MD  Misc Natural Products (NARCOSOFT HERBAL LAX PO) Take by mouth.    [provider]  mometasone-formoterol (DULERA) 200-5 MCG/ACT AERO Inhale 2 puffs into the lungs 2 (two) times daily. 07/16/16   Maple HudsonGilbert, Richard L Jr., MD  Multiple Vitamin tablet Take by mouth. 05/17/12   [provider]  pravastatin (PRAVACHOL) 20 MG tablet Take 1 tablet (20 mg total) by mouth daily. 03/18/11   Karie SchwalbeLetvak, Richard I, MD  Sennosides 25 MG TABS Take by mouth. 05/17/12   [provider]  tamsulosin (FLOMAX) 0.4 MG CAPS capsule Take 2 tablets daily 07/16/16   Maple HudsonGilbert, Richard L Jr., MD  vitamin B-12 (CYANOCOBALAMIN) 500 MCG tablet Take 500 mcg by mouth daily.    [provider]     Family History No family history on file.  Social History Social History   Tobacco Use  . Smoking status: Former Smoker    Packs/day: 3.00    Types: Cigarettes  . Smokeless tobacco: Former NeurosurgeonUser    Types: Snuff, Chew  . Tobacco comment: quit 40 years ago  Substance Use Topics  . Alcohol use: No  . Drug use: No     Allergies   Sulfa antibiotics   Review of Systems Review of Systems  Ten systems reviewed and are negative for acute change, except as noted in the HPI.   Physical Exam Updated Vital Signs BP (!) 145/64 (BP Location: Right Arm)   Pulse 82   Temp 97.9 F (36.6 C) (Oral)   Resp 18   Ht 5\' 9"  (1.753 m)   Wt 76.2 kg (168 lb)   SpO2 98%   BMI 24.81 kg/m   Physical Exam  Constitutional: He is oriented to person, place, and time. He appears well-developed and well-nourished. No distress.  Patient hard of hearing.  HENT:  Head: Normocephalic.  10 cm laceration involving the forehead and left parietal scalp.  Oozing blood at this time, no arterial bleeds noticed.  Eyes: Conjunctivae are normal. No scleral icterus.  Neck: Normal range of motion. Neck supple.  Cardiovascular: Normal rate, regular rhythm and normal heart sounds.  Pulmonary/Chest: Effort normal and breath sounds normal. No respiratory distress.  Abdominal: Soft. There is no tenderness.  Musculoskeletal: He exhibits no edema.  Neurological: He is alert and oriented to person, place, and time.  Speech is clear and goal oriented, follows commands Major Cranial nerves without deficit, no facial droop Normal strength in upper and lower extremities on the right, mild weakness in the left upper and lower extremity as compared to the right side  Sensation normal to light and sharp touch Moves extremities without ataxia, coordination intact Normal finger to nose and rapid alternating movements Normal heel-shin No pronator drift Unsteady gait.  Patient requires 1 person assist   Skin: Skin is  warm and dry. He is not diaphoretic.  Psychiatric: His behavior is normal.  Nursing note and vitals reviewed.      ED Treatments / Results  Labs (all labs ordered are listed, but only abnormal results are displayed) Labs Reviewed - No data to display  EKG  EKG Interpretation None       Radiology No results found.  Procedures .Marland Kitchen.Laceration Repair Date/Time: 02/13/2017 11:11 PM Performed by: Arthor CaptainHarris, Columbia Pandey, PA-C Authorized by: Arthor CaptainHarris, Emmarie Sannes, PA-C   Consent:    Consent obtained:  Verbal   Consent given by:  Patient   Risks discussed:  Infection, pain, poor cosmetic result and retained foreign body   Alternatives discussed:  No treatment  and delayed treatment Anesthesia (see MAR for exact dosages):    Anesthesia method:  Local infiltration   Local anesthetic:  Lidocaine 1% WITH epi Laceration details:    Location:  Scalp   Scalp location:  Frontal (Frontal and left parietal)   Length (cm):  10   Depth (mm):  10 Repair type:    Repair type:  Complex Pre-procedure details:    Preparation:  Patient was prepped and draped in usual sterile fashion Exploration:    Limited defect created (wound extended): yes     Wound extent: fascia violated and tendon damage     Wound extent comment:  Interruption of the galea   Tendon damage extent:  Partial transection   Tendon repair plan:  Repair at this visit   Contaminated: no   Treatment:    Area cleansed with:  Betadine   Amount of cleaning:  Extensive   Irrigation solution:  Sterile water   Irrigation method:  Pressure wash   Undermining:  None Tendon repair:    Suture size:  3-0   Tendon suture type: vicryl.   Tendon suture technique: Running.   Number of sutures:  11 Skin repair:    Repair method:  Sutures   Suture size:  3-0   Suture material:  Prolene   Suture technique: VM and SI.   Number of sutures:  11 Approximation:    Approximation:  Close   Vermilion border: well-aligned   Post-procedure details:     Dressing:  Non-adherent dressing   Patient tolerance of procedure:  Tolerated well, no immediate complications   (including critical care time)  Medications Ordered in ED Medications - No data to display   Initial Impression / Assessment and Plan / ED Course  I have reviewed the triage vital signs and the nursing notes.  Pertinent labs & imaging results that were available during my care of the patient were reviewed by me and considered in my medical decision making (see chart for details).      Patient with fall, disequilibrium and large scalp laceration.  Question posterior circulation stroke and patient will be admitted for workup.  He also has a history of aortic stenosis and may be a presyncopal event that led to his fall today.  Patient's laceration was deep and there was interruption of the galea.  I was able to repair this and this will be treated as an open fracture with 2 g IV Ancef.  His tetanus vaccination was due in March 2019 and was updated today.  The patient has been alert and oriented throughout his visit.  He will be admitted by the family medicine service.  Final Clinical Impressions(s) / ED Diagnoses   Final diagnoses:  None    ED Discharge Orders    None       Arthor CaptainHarris, Inaki Vantine, PA-C 02/13/17 2318    Benjiman CorePickering, Nathan, MD 02/13/17 [id number]    Benjiman CorePickering, Nathan, MD 02/13/17 [phone]

## 2017-02-13 NOTE — ED Notes (Signed)
Patient transported to CT 

## 2017-02-13 NOTE — H&P (Signed)
Family Medicine Teaching [facility] Admission History and Physical Service Pager: [phone]  Patient name: Charles Maresheodore Donegan Medical record number: [id number] Date of birth: November 25, 1920 Age: 83 y.o. Gender: male  Primary Care Provider: Maple HudsonGilbert, Richard L Jr., MD Consultants: None Code Status: Partial (Does not want Cardiac Resuscitation but would want intubation/respiratory support in case of an emergency)  Chief Complaint: unsteadiness with falls and head laceration  Assessment and Plan: Charles Strickland is a 83 y.o. male presenting with falls today and resulting head laceration. PMH is significant for aortic stenosis s/p bovine valve replacement in 2012, carotid artery occlusion, HLD, HTN, and OA.   #Falls: Had a tumble this morning on unsteady seat and fell forward this afternoon after standing up from a curb. Denies LOC. Reports exertional dyspnea and tiring, though not new. Denies infectious symptoms like cough, dysuria or fever. WBC slightly elevated at 10.9. Afebrile. Not hypoglycemic; CBG 116. No recent medication changes. Denies chest pain; EKG unchanged (prolonged PR interval and QT, RBBB and LAFB previously noted); negative I-stat troponin. Could have been vagal response to abruptly standing, as usually very careful to take his time from sitting to standing. Concern for old stroke, as patient reports having noticed strength on right seems greater than left for years and has not been evaluated for this before. CT head in ED showed no acute intracranial abnormalities (did show atrophy with chronic small vessel ischemia and "chronic right-sided thalamic, caudate, and internal capsule lacunar infarcts"). Stroke risk factors include HLD, carotid artery disease, HTN, and smoking history (remote).  - Admit to telemetry, attending Dr. Jennette KettleNeal - Ordered stroke order set with risk factor labs of hgb A1c, lipid panel; carotid dopplers given history of occlusion; CXR given complaint of dyspnea; ECHO  given history of aortic stenosis  - Up with assistance - Consider Neurology consult in a.m., though patient without new deficits and CT head without acute ischemic changes - PT/OT consult - Obtain orthostatic vital signs - I/Os - UA to r/o infection, though patient without symptoms - Consider MR/MRA brain to complete stroke w/o  #Head Laceration: Deep to periosteum at one point. Bleeding controlled. Hgb ~12 at baseline. 10.9 on admission. Reports neck pain but good ROM. CT c-spine showed cervical spondylosis without acute cervical spine fracture. - Sutured after thorough cleansing in ED  - s/p TDAP (was due in March) and ancef 2 g in ED - tylenol prn and oxy IR 5 mg if severe pain - ice prn - a.m. CBC  #Aortic stenosis: Cardiologist is Dr. Lady GaryFath in [place]. Per medical record, "underwent aortic valve replacement for calcific aortic stenosis as well as coronary artery bypass grafting with a left internal mammary to the LAD and a saphenous vein graft to the right PDA" in 2012.  - Will obtain ECHO  #HLD: Takes pravastatin 20 mg daily - continue home medication  #HTN: On amlodipine 5 mg, metoprolol tartrate 50 mg BID, losartan-hctz 100-25 mg daily, benazepril 40 mg daily - Will hold hyzaar and benazepril in setting of AKI - continue amlodipine, metoprolol  #AKI: SCr 1.40 on admission; baseline ~1.00. MM tacky. Suspect inadequate water intake though BUN:Cr < 20.  - holding ACEi and ARB  #Sinus trouble: Takes flonase and claritin at home.  - Will hold claritin given age - continue flonase  #Dyspnea: Takes albuterol and dulera prn at home. Unsure of any diagnosis (COPD vs asthma). O2 saturation 100% on room air.  - Will obtain CXR  #History of hypothyroidism: Not on medication; last TSH  4.190 on 06/2016.  - Repeat TSH in a.m.  #Tobacco abuse: Will sometimes still use snuff.   #Hypokalemia: K to 3.1 on admission - kdur 40 - Monitor with BMP - Obtain Mg in a.m.  FEN/GI: NPO  until passes swallow screen, then can have heart healthy diet Prophylaxis: lovenox  Disposition: Pending evaluation of recent falls  History of Present Illness:  Charles Strickland is a 83 y.o. male presenting after a fall this afternoon. He is accompanied by his daughter. Patient reports feeling well this morning. He did do some yardwork this a.m. and mentions tumbling forward onto his shoulder but attributes this to his stool's legs getting caught in mulch. He was then out after purchasing a fuse for his stove and had difficulty finding his car in the parking lot. He is very hard of hearing and could not hear his car horn when he tried to locate the car with his keys and enlisted the help of a passerby. He starting feeling anxious/overwhelmed, so he sat on the curb of a median momentarily then stood up quickly to join the person who was trying to help him find his car. He then fell forward and hit his head, resulting in a deep cut. He denies losing consciousness, vomiting, or trouble remembering events before or afterwards. His daughter denies concerns about memory and says patient is very independent and active. He uses a can mostly to get around due to his knees hurting. He also has a walker and crutches available.  Patient says last fall happened about 1 year ago after standing up quickly after fixing up flooring in his work room.   Review Of Systems: Per HPI with the following additions:   Review of Systems  Constitutional: Negative for chills and fever.  HENT: Positive for hearing loss. Negative for sore throat.   Eyes: Negative for double vision and pain.  Respiratory: Positive for shortness of breath (on exertion). Negative for cough.   Cardiovascular: Negative for chest pain and palpitations.  Gastrointestinal: Negative for abdominal pain, nausea and vomiting.       Complains of loose bowel movements and constipation depending on how he takes his laxative.   Genitourinary: Negative for  dysuria and urgency.  Musculoskeletal: Negative for back pain and myalgias.  Skin: Negative for rash.  Neurological: Positive for dizziness. Negative for sensory change.    Patient Active Problem List   Diagnosis Date Noted  . CCF (congestive cardiac failure) (HCC) 01/15/2015  . CAFL (chronic airflow limitation) (HCC) 01/15/2015  . Borderline diabetes 01/15/2015  . Heart valve disease 01/15/2015  . HLD (hyperlipidemia) 01/15/2015  . BP (high blood pressure) 01/15/2015  . Hyperthyroidism 01/15/2015  . Adult hypothyroidism 01/15/2015  . Arthritis, degenerative 01/15/2015  . Closed fracture of sternum 01/15/2015  . Carotid artery occlusion 11/09/2013  . Benign essential HTN 07/07/2013  . Aortic valve defect 07/07/2013    Past Medical History: Past Medical History:  Diagnosis Date  . Hard of hearing     Past Surgical History: Past Surgical History:  Procedure Laterality Date  . HERNIA REPAIR  03/12/09   right    Social History: Social History   Tobacco Use  . Smoking status: Former Smoker    Packs/day: 3.00    Types: Cigarettes  . Smokeless tobacco: Former Neurosurgeon    Types: Snuff, Chew  . Tobacco comment: quit 40 years ago  Substance Use Topics  . Alcohol use: No  . Drug use: No   Additional social history: Lives  independently; does his own bills, does chores, does his own cooking; daughter helps with some shopping and goes out with him; remote smoking history but still uses snuff; does not drink EtOH, does not use drugs. Former Passenger transport manager.  Please also refer to relevant sections of EMR.  Family History: No family history on file. Father died in his 17s. Mother died in her 40s.   Allergies and Medications: Allergies  Allergen Reactions  . Sulfa Antibiotics Rash   No current facility-administered medications on file prior to encounter.    Current Outpatient Medications on File Prior to Encounter  Medication Sig Dispense Refill  . albuterol  (VENTOLIN HFA) 108 (90 BASE) MCG/ACT inhaler Inhale 2 puffs 3 (three) times daily as needed into the lungs for wheezing or shortness of breath.     Marland Kitchen amLODipine (NORVASC) 5 MG tablet Take 1 tablet (5 mg total) by mouth daily. 30 tablet 0  . aspirin EC 81 MG tablet Take 81 mg daily by mouth.    . benazepril (LOTENSIN) 40 MG tablet Take 40 mg daily by mouth.     . Bisacodyl (LAXATIVE PO) Take 1 tablet 2 (two) times daily by mouth.    . cholecalciferol (VITAMIN D) 1000 units tablet Take 1,000 Units by mouth daily.    . fluticasone (FLONASE) 50 MCG/ACT nasal spray Place 1 spray daily as needed into both nostrils for allergies or rhinitis.    Marland Kitchen loratadine (CLARITIN) 10 MG tablet Take 10 mg by mouth daily.    Marland Kitchen losartan-hydrochlorothiazide (HYZAAR) 100-25 MG tablet Take 1 tablet daily by mouth.     . metoprolol tartrate (LOPRESSOR) 50 MG tablet Take 50 mg 2 (two) times daily by mouth.    . mometasone-formoterol (DULERA) 200-5 MCG/ACT AERO Inhale 2 puffs into the lungs 2 (two) times daily. 1 Inhaler 12  . Multiple Vitamin (MULTIVITAMIN WITH MINERALS) TABS tablet Take 1 tablet See admin instructions by mouth. Take the contents of [employer] by mouth every morning    . pravastatin (PRAVACHOL) 20 MG tablet Take 1 tablet (20 mg total) by mouth daily. (Patient taking differently: Take 20 mg at bedtime by mouth. ) 30 tablet 0  . tamsulosin (FLOMAX) 0.4 MG CAPS capsule Take 2 tablets daily (Patient taking differently: Take 0.8 mg daily by mouth. Take 2 tablets daily) 180 capsule 3  . vitamin B-12 (CYANOCOBALAMIN) 500 MCG tablet Take 500 mcg by mouth daily.    . metoprolol (TOPROL-XL) 50 MG 24 hr tablet Take 1 tablet (50 mg total) by mouth daily. (Patient not taking: Reported on 02/13/2017) 30 tablet 0    Objective: BP 136/66   Pulse 78   Temp 97.9 F (36.6 C)   Resp (!) 22   Ht 5\' 9"  (1.753 m)   Wt 168 lb (76.2 kg)   SpO2 96%   BMI 24.81 kg/m  Exam: General: Elderly gentleman, resting in  bed, very pleasant Eyes: EOMI, PERRL, arcus senilis  ENTM: MM slightly tacky, minimal nasal congestion Neck: Good ROM, no JVD noted Cardiovascular: 3/6 blowing SEM, RRR Respiratory: Moving air well, mild bibasilar crackles, able to speak in complete sentences with ease Gastrointestinal: +BS, soft, mild diastasis recti MSK: No LE edema, moves all spontaneously Derm: Large laceration to L forehead with some periosteum exposed; bleeding controlled Neuro: AOx4, fluid speech, CNIV-XII and coordination intact; L UE strength and grip strength 4+/5 compared to 5/5 on R. LEs 5/5 strength bilaterally.  Psych: Normal mood and affect.  Labs and Imaging: CBC BMET  Recent Labs  Lab 02/13/17 1807 02/13/17 1826  WBC 10.9*  --   HGB 11.2* 10.9*  HCT 31.8* 32.0*  PLT 188  --    Recent Labs  Lab 02/13/17 1807 02/13/17 1826  NA 138 140  K 3.1* 3.1*  CL 105 102  CO2 26  --   BUN 18 19  CREATININE 1.40* 1.40*  GLUCOSE 111* 111*  CALCIUM 9.6  --      Ct Head Wo Contrast  Result Date: 02/13/2017 CLINICAL DATA:  Laceration to head after fall today. EXAM: CT HEAD WITHOUT CONTRAST CT CERVICAL SPINE WITHOUT CONTRAST TECHNIQUE: Multidetector CT imaging of the head and cervical spine was performed following the standard protocol without intravenous contrast. Multiplanar CT image reconstructions of the cervical spine were also generated. COMPARISON:  None. FINDINGS: CT HEAD FINDINGS Brain: Sulcal and ventricular prominence consistent with atrophy. Chronic right-sided thalamic, caudate, and internal capsule lacunar infarcts with chronic appearing small vessel ischemic disease of periventricular and deep white matter. Vascular: No hyperdense vessel or unexpected calcification. Skull: No skull fracture. Sinuses/Orbits: Bilateral lens replacements. Intact orbits and globes. No acute sinus disease. Other: Bilateral mastoid effusions with near complete opacification of the right mastoid and opacification of the  left mastoid. Scalp hematoma and contusion overlying the left frontoparietal skull with soft tissue lacerations. CT CERVICAL SPINE FINDINGS Alignment: Intact craniocervical relationship. Osteoarthritis of the atlantodental interval with joint space narrowing. No acute fracture. Mild grade 1 anterolisthesis of C4 on C5 and C5 on C6. Skull base and vertebrae: No acute fracture. No primary bone lesion or focal pathologic process. Soft tissues and spinal canal: No prevertebral fluid or swelling. No visible canal hematoma. Disc levels: Cervical spondylosis with multilevel disc space narrowing. Vacuum disc phenomenon noted C5-6 and C6-7. Multilevel degenerative facet arthropathy with joint space narrowing, facet hypertrophy and sclerosis. Uncovertebral joint osteoarthritis is noted on the right at C3-4, bilaterally from C4 through T1. Upper chest: Scarring at the apices. Other: Nuchal ligament ossifications noted. Bilateral extracranial carotid arteriosclerosis. IMPRESSION: 1. Soft tissue swelling of the scalp with associated lacerations and contusions overlying the left frontoparietal skull. No underlying skull fracture. 2. Atrophy with chronic small vessel ischemia. No acute intracranial abnormality. 3. Cervical spondylosis without acute cervical spine fracture. Electronically Signed   By: Tollie Ethavid  Kwon M.D.   On: 02/13/2017 19:14   Ct Cervical Spine Wo Contrast  Result Date: 02/13/2017 CLINICAL DATA:  Laceration to head after fall today. EXAM: CT HEAD WITHOUT CONTRAST CT CERVICAL SPINE WITHOUT CONTRAST TECHNIQUE: Multidetector CT imaging of the head and cervical spine was performed following the standard protocol without intravenous contrast. Multiplanar CT image reconstructions of the cervical spine were also generated. COMPARISON:  None. FINDINGS: CT HEAD FINDINGS Brain: Sulcal and ventricular prominence consistent with atrophy. Chronic right-sided thalamic, caudate, and internal capsule lacunar infarcts with  chronic appearing small vessel ischemic disease of periventricular and deep white matter. Vascular: No hyperdense vessel or unexpected calcification. Skull: No skull fracture. Sinuses/Orbits: Bilateral lens replacements. Intact orbits and globes. No acute sinus disease. Other: Bilateral mastoid effusions with near complete opacification of the right mastoid and opacification of the left mastoid. Scalp hematoma and contusion overlying the left frontoparietal skull with soft tissue lacerations. CT CERVICAL SPINE FINDINGS Alignment: Intact craniocervical relationship. Osteoarthritis of the atlantodental interval with joint space narrowing. No acute fracture. Mild grade 1 anterolisthesis of C4 on C5 and C5 on C6. Skull base and vertebrae: No acute fracture. No primary  bone lesion or focal pathologic process. Soft tissues and spinal canal: No prevertebral fluid or swelling. No visible canal hematoma. Disc levels: Cervical spondylosis with multilevel disc space narrowing. Vacuum disc phenomenon noted C5-6 and C6-7. Multilevel degenerative facet arthropathy with joint space narrowing, facet hypertrophy and sclerosis. Uncovertebral joint osteoarthritis is noted on the right at C3-4, bilaterally from C4 through T1. Upper chest: Scarring at the apices. Other: Nuchal ligament ossifications noted. Bilateral extracranial carotid arteriosclerosis. IMPRESSION: 1. Soft tissue swelling of the scalp with associated lacerations and contusions overlying the left frontoparietal skull. No underlying skull fracture. 2. Atrophy with chronic small vessel ischemia. No acute intracranial abnormality. 3. Cervical spondylosis without acute cervical spine fracture. Electronically Signed   By: Tollie Eth M.D.   On: 02/13/2017 19:14   Casey Burkitt, MD 02/13/2017, 8:42 PM PGY-3, [facility] FPTS Intern pager: [phone], text pages welcome

## 2017-02-13 NOTE — ED Notes (Signed)
Pt states bottom dentures are at home. Only has top ones in at this time. Hearing aides given to daughter per Dominic PeaJamie B RN.

## 2017-02-13 NOTE — ED Notes (Signed)
ED Provider at bedside. 

## 2017-02-13 NOTE — Progress Notes (Signed)
Received report from Candace,RN in the ED. 

## 2017-02-13 NOTE — ED Triage Notes (Signed)
Pt arrived [facility] [place] where pt had a fall in the parking lot hitting his head on the curb. Pt unsure of medications. Denies pain. A&OX4

## 2017-02-13 NOTE — ED Notes (Signed)
EDPA at bedside to suture

## 2017-02-14 ENCOUNTER — Inpatient Hospital Stay (HOSPITAL_COMMUNITY): Payer: Medicare Other

## 2017-02-14 ENCOUNTER — Encounter (HOSPITAL_COMMUNITY): Payer: Self-pay

## 2017-02-14 ENCOUNTER — Other Ambulatory Visit (HOSPITAL_COMMUNITY): Payer: Medicare Other

## 2017-02-14 DIAGNOSIS — R42 Dizziness and giddiness: Secondary | ICD-10-CM

## 2017-02-14 LAB — BASIC METABOLIC PANEL
ANION GAP: 7 (ref 5–15)
Anion gap: 9 (ref 5–15)
BUN: 16 mg/dL (ref 6–20)
BUN: 18 mg/dL (ref 6–20)
CALCIUM: 9.2 mg/dL (ref 8.9–10.3)
CHLORIDE: 103 mmol/L (ref 101–111)
CO2: 25 mmol/L (ref 22–32)
CO2: 26 mmol/L (ref 22–32)
CREATININE: 1.29 mg/dL — AB (ref 0.61–1.24)
Calcium: 9.1 mg/dL (ref 8.9–10.3)
Chloride: 105 mmol/L (ref 101–111)
Creatinine, Ser: 1.14 mg/dL (ref 0.61–1.24)
GFR calc Af Amer: >60 mL/min (ref >60)
GFR calc Af Amer: 53 mL/min — ABNORMAL LOW (ref >60)
GFR calc non Af Amer: 53 mL/min — ABNORMAL LOW (ref >60)
GFR, EST NON AFRICAN AMERICAN: 46 mL/min — AB (ref >60)
GLUCOSE: 128 mg/dL — AB (ref 65–99)
Glucose, Bld: 119 mg/dL — ABNORMAL HIGH (ref 65–99)
POTASSIUM: 2.9 mmol/L — AB (ref 3.5–5.1)
Potassium: 3.3 mmol/L — ABNORMAL LOW (ref 3.5–5.1)
SODIUM: 137 mmol/L (ref 135–145)
Sodium: 138 mmol/L (ref 135–145)

## 2017-02-14 LAB — CBC
HEMATOCRIT: 29.9 % — AB (ref 39.0–52.0)
Hemoglobin: 10.3 g/dL — ABNORMAL LOW (ref 13.0–17.0)
MCH: 28.8 pg (ref 26.0–34.0)
MCHC: 34.4 g/dL (ref 30.0–36.0)
MCV: 83.5 fL (ref 78.0–100.0)
Platelets: 164 10*3/uL (ref 150–400)
RBC: 3.58 MIL/uL — ABNORMAL LOW (ref 4.22–5.81)
RDW: 13.3 % (ref 11.5–15.5)
WBC: 12.7 10*3/uL — AB (ref 4.0–10.5)

## 2017-02-14 LAB — HEMOGLOBIN A1C
Hgb A1c MFr Bld: 5.5 % (ref 4.8–5.6)
Mean Plasma Glucose: 111.15 mg/dL

## 2017-02-14 LAB — RAPID URINE DRUG SCREEN, HOSP PERFORMED
AMPHETAMINES: NOT DETECTED
BENZODIAZEPINES: NOT DETECTED
Barbiturates: NOT DETECTED
COCAINE: NOT DETECTED
OPIATES: NOT DETECTED
TETRAHYDROCANNABINOL: NOT DETECTED

## 2017-02-14 LAB — LIPID PANEL
CHOL/HDL RATIO: 3.6 ratio
CHOLESTEROL: 131 mg/dL (ref 0–200)
HDL: 36 mg/dL — ABNORMAL LOW (ref >40)
LDL Cholesterol: 81 mg/dL (ref 0–99)
TRIGLYCERIDES: 70 mg/dL (ref <150)
VLDL: 14 mg/dL (ref 0–40)

## 2017-02-14 LAB — TSH: TSH: 2.083 u[IU]/mL (ref 0.350–4.500)

## 2017-02-14 LAB — MAGNESIUM: MAGNESIUM: 1.4 mg/dL — AB (ref 1.7–2.4)

## 2017-02-14 MED ORDER — POTASSIUM CHLORIDE CRYS ER 20 MEQ PO TBCR
40.0000 meq | EXTENDED_RELEASE_TABLET | Freq: Once | ORAL | Status: AC
  Administered 2017-02-14: 40 meq via ORAL
  Filled 2017-02-14: qty 2

## 2017-02-14 MED ORDER — MAGNESIUM SULFATE IN D5W 1-5 GM/100ML-% IV SOLN
1.0000 g | Freq: Once | INTRAVENOUS | Status: AC
  Administered 2017-02-14: 1 g via INTRAVENOUS
  Filled 2017-02-14: qty 100

## 2017-02-14 NOTE — Progress Notes (Signed)
NURSING PROGRESS NOTE  Charles Evenerheodore BowenMRN: [id number] Admission Data: 02/13/17 at 2305 Attending Provider: Nestor RampNeal, Sara L, MD PCP: Maple HudsonGilbert, Richard L Jr., MD Code status: Limited  Allergies:  Allergies  Allergen Reactions  . Sulfa Antibiotics Rash   Past Medical History:  Past Medical History:  Diagnosis Date  . Hard of hearing    Past Surgical History:  Past Surgical History:  Procedure Laterality Date  . HERNIA REPAIR  03/12/09   right   Charles Strickland Bilotti is a 83 y.o. male patient, arrived to floor in room 5W16 via stretcher, transferred from ED. Patient alert and oriented X 4. No acute distress noted. Denies pain.   Vital signs: Oral temperature 98.3 F (36.8 C), Blood pressure 147/67, Pulse 94, RR 18, SpO2 99 % on room air. Height 5'7.5", weight 153 lbs (69.4 kg).   Cardiac monitoring: Telemetry box 5W # 18 in place. Second verified completed.  IV access: Left AC-saline locked on arrival from ED; condition patent and no redness.  Skin: intact, no pressure ulcer noted in sacral area. Skin is dry and flaky. Laceration with stitches noted on top of head-kerlix dressing applied; abrasions noted on both legs  Patient's ID armband verified with patient and in place. Information packet given to patient. Fall risk assessed, SR up X2, patient able to verbalize understanding of risks associated with falls and to call nurse or staff to assist before getting out of bed. Patient oriented to room and equipment. Call bell within reach.

## 2017-02-14 NOTE — Progress Notes (Signed)
OT Cancellation Note  Patient Details Name: Charles Strickland MRN: [id number] DOB: Jul 18, 1920   Cancelled Treatment:    Reason Eval/Treat Not Completed: Patient at procedure or test/ unavailable. Pt currently working with SLP. Spoke with Pt and family and will come back for eval (especially check vision) as schedule allows.  Evern BioLaura J Christphor Groft 02/14/2017, 4:04 PM  Sherryl MangesLaura Mckyla Deckman OTR/L [phone]

## 2017-02-14 NOTE — Progress Notes (Signed)
Carotid duplex prelim: right 1-39% ICA stenosis, left 40-59% ICA stenosis, upper end of range. Farrel DemarkJill Eunice, RDMS, RVT

## 2017-02-14 NOTE — Evaluation (Signed)
Speech Language Pathology Evaluation Patient Details Name: Collene Maresheodore Sudduth MRN: [id number] DOB: 09/10/1920 Today's Date: 02/14/2017 Time: [id number] SLP Time Calculation (min) (ACUTE ONLY): 38 min  Problem List:  Patient Active Problem List   Diagnosis Date Noted  . Imbalance 02/13/2017  . Loss of balance 02/13/2017  . CCF (congestive cardiac failure) (HCC) 01/15/2015  . CAFL (chronic airflow limitation) (HCC) 01/15/2015  . Borderline diabetes 01/15/2015  . Heart valve disease 01/15/2015  . HLD (hyperlipidemia) 01/15/2015  . BP (high blood pressure) 01/15/2015  . Hyperthyroidism 01/15/2015  . Adult hypothyroidism 01/15/2015  . Arthritis, degenerative 01/15/2015  . Closed fracture of sternum 01/15/2015  . Carotid artery occlusion 11/09/2013  . Benign essential HTN 07/07/2013  . Aortic valve defect 07/07/2013   Past Medical History:  Past Medical History:  Diagnosis Date  . Coronary artery disease   . Hard of hearing   . Hyperlipidemia   . Hypertension    Past Surgical History:  Past Surgical History:  Procedure Laterality Date  . HERNIA REPAIR  03/12/09   right   HPI:  Pt is a [age] y.o. male with PMH of aortic stenosis s/p bovine valve replacement in 2012, carotid artery occlusion, HLD, HTN, and OApresenting [id number] resulting head lacerationrepaired in the ED.Head CT showed Soft tissue swelling of the scalp with associated lacerations and contusions overlying the left frontoparietal skull. No underlying skull fracture. There is concern for an old stroke- pt reports strength has been greater on R than L for years. At baseline pt independent and active. SLP eval ordered as part of stroke workup.    Assessment / Plan / Recommendation Clinical Impression  Overall pt's cognitive-linguistic skills were within functional limits for tasks assessed with performance hindered at times due to significant hearing loss. Pt benefited from directions being written out rather  than read aloud to him. Pt was pleasant and very conversant throughout evaluation, able to recall specific details of events leading to fall, was oriented x4. Pt had difficulty with short-term memory task (recall 3 words); however, this was believed to be impacted by hearing loss as he had initial difficulty repeating the words aloud after therapist. Daughter at bedside reports feeling that pt is at baseline level of function. Pt lives at home and daughter checks in regularly and assists with filling pill box. Given these findings, SLP will sign off at this time. Please re-consult if needs arise.     SLP Assessment  SLP Recommendation/Assessment: Patient does not need any further Speech Lanaguage Pathology Services SLP Visit Diagnosis: Cognitive communication deficit (R41.841)    Follow Up Recommendations  None    Frequency and Duration           SLP Evaluation Cognition  Overall Cognitive Status: Within Functional Limits for tasks assessed Arousal/Alertness: Awake/alert Orientation Level: Oriented X4 Attention: Selective Selective Attention: Appears intact Memory: Impaired Memory Impairment: Decreased short term memory Decreased Short Term Memory: Verbal basic Awareness: Appears intact Problem Solving: Appears intact Safety/Judgment: Appears intact       Comprehension  Auditory Comprehension Overall Auditory Comprehension: Appears within functional limits for tasks assessed Yes/No Questions: Within Functional Limits Commands: Within Functional Limits(trouble x1 with "before" direction) Conversation: Complex Reading Comprehension Reading Status: Within funtional limits    Expression Expression Primary Mode of Expression: Verbal Verbal Expression Overall Verbal Expression: Appears within functional limits for tasks assessed Initiation: No impairment Level of Generative/Spontaneous Verbalization: Conversation [employer]-Verbal Means of Communication: Not applicable   Oral / Motor   Oral Motor/Sensory Function Overall  Oral Motor/Sensory Function: Within functional limits Motor Speech Overall Motor Speech: Appears within functional limits for tasks assessed Articulation: Within functional limitis   GO                    Metro KungAmy K Oleksiak, MA, CCC-SLP 02/14/2017, 4:40 PM  [phone]

## 2017-02-14 NOTE — Evaluation (Signed)
Physical Therapy Evaluation Patient Details Name: Charles Strickland MRN: [id number] DOB: [birth year] Today's Date: 02/14/2017   History of Present Illness  83 y.o. male presenting after fall with laceration to head. head CT negative. PMH significant of aortic stenosis s/p bovine valve replacement, CAD, HLD, HTN, OA, and hearing loss.   Clinical Impression  Pt presents with impaired balance, impaired mobility, and generalized muscular weakness. Pt's daughter and grandson present in room. Pt tolerates treatment well with no c/o pain. During ambulation with RW and min guard for safety, pt drifts toward left side, bumping into objects/wall several times. Pt reports this is new issue. Pt's family reports that he is usually very careful in his home environment, as he is worried about falling. Pt is a good candidate for skilled PT in order to improve upon above mentioned deficits and increase level of independence. Home health recommendation for assessment of safe home environment and 24/7 supervision for safety.   BP supine: 11/48, BP sitting EOB: 81/55, BP standing 124/41, BP standing after 3-min: 100/54, BP post-session seated: 112/85. Pt asymptomatic. RN notified.     Follow Up Recommendations Home health PT;Supervision/Assistance - 24 hour    Equipment Recommendations  None recommended by PT    Recommendations for Other Services       Precautions / Restrictions Precautions Precautions: Fall Restrictions Weight Bearing Restrictions: No      Mobility  Bed Mobility Overal bed mobility: Needs Assistance Bed Mobility: Supine to Sit     Supine to sit: Min assist;HOB elevated     General bed mobility comments: Pt requiring increased time for bed mobility.   Transfers Overall transfer level: Needs assistance Equipment used: Rolling walker (2 wheeled) Transfers: Sit to/from Stand Sit to Stand: Min guard         General transfer comment: sit-to-stand x1. VCs to push with UEs from  bed. Pt requires increased time for sit-to-stand.   Ambulation/Gait Ambulation/Gait assistance: Min guard Ambulation Distance (Feet): 150 Feet Assistive device: Rolling walker (2 wheeled) Gait Pattern/deviations: Drifts right/left;Step-through pattern;Trunk flexed Gait velocity: decreased   General Gait Details: Pt drifts towards left and bumps left wall four times during session. Pt cued to stand up straight, however pt states his neck is "stuck that way" and he has been trying to figure out how to straighten it up.   Stairs            Wheelchair Mobility    Modified Rankin (Stroke Patients Only)       Balance Overall balance assessment: Needs assistance Sitting-balance support: No upper extremity supported;Feet supported Sitting balance-Leahy Scale: Fair Sitting balance - Comments: Pt able to sit EOB with UEs in lap. Postural control: Other (comment)(slouched over) Standing balance support: Bilateral upper extremity supported;During functional activity Standing balance-Leahy Scale: Poor Standing balance comment: Pt maintains BUEs on RW during standing to maintain balance.                              Pertinent Vitals/Pain Pain Assessment: No/denies pain    Home Living Family/patient expects to be discharged to:: Private residence Living Arrangements: Alone Available Help at Discharge: Family;Available PRN/intermittently Type of Home: House Home Access: Stairs to enter Entrance Stairs-Rails: Left Entrance Stairs-Number of Steps: 5 Home Layout: Laundry or work area in basement;Able to live on main level with bedroom/bathroom Home Equipment: Grab bars - tub/shower;Walker - 2 wheels;Walker - 4 wheels;Crutches;Shower seat;Cane - single point;Wheelchair - manual Additional Comments: Pt  uses cane when in community. Pt is furniture walker in home environment.     Prior Function Level of Independence: Independent with assistive device(s)                Hand Dominance        Extremity/Trunk Assessment   Upper Extremity Assessment Upper Extremity Assessment: Defer to OT evaluation    Lower Extremity Assessment Lower Extremity Assessment: Generalized weakness    Cervical / Trunk Assessment Cervical / Trunk Assessment: Kyphotic(cervical region stiffness)  Communication   Communication: HOH  Cognition Arousal/Alertness: Lethargic Behavior During Therapy: Flat affect;WFL for tasks assessed/performed Overall Cognitive Status: Within Functional Limits for tasks assessed                                        General Comments General comments (skin integrity, edema, etc.): Pt's daughter and grandson present int he room. Daughter reports that pt is very careful at home, being that he is concerned about falling. BP supine: 11/48, BP sitting EOB: 81/55, BP standing 124/41, BP standing after 3-min: 100/54, BP post-session seated: 112/85.  Pt reports that he is still driving himself around.     Exercises     Assessment/Plan    PT Assessment Patient needs continued PT services  PT Problem List Decreased strength;Decreased balance;Decreased mobility;Decreased activity tolerance;Cardiopulmonary status limiting activity       PT Treatment Interventions Functional mobility training;Balance training;Patient/family education;DME instruction;Gait training;Therapeutic activities;Neuromuscular re-education;Wheelchair mobility training;Manual techniques;Therapeutic exercise;Stair training    PT Goals (Current goals can be found in the Care Plan section)  Acute Rehab PT Goals Patient Stated Goal: to return home PT Goal Formulation: With patient Time For Goal Achievement: 02/28/17 Potential to Achieve Goals: Good    Frequency Min 3X/week   Barriers to discharge Decreased caregiver support Pt lives alone.     Co-evaluation               AM-PAC PT "6 Clicks" Daily Activity  Outcome Measure Difficulty turning  over in bed (including adjusting bedclothes, sheets and blankets)?: A Little Difficulty moving from lying on back to sitting on the side of the bed? : Unable Difficulty sitting down on and standing up from a chair with arms (e.g., wheelchair, bedside commode, etc,.)?: A Little Help needed moving to and from a bed to chair (including a wheelchair)?: A Little Help needed walking in hospital room?: A Little Help needed climbing 3-5 steps with a railing? : A Lot 6 Click Score: 15    End of Session Equipment Utilized During Treatment: Gait belt Activity Tolerance: Patient tolerated treatment well Patient left: with call bell/phone within reach;in chair;with chair alarm set;with family/visitor present Nurse Communication: Mobility status PT Visit Diagnosis: Unsteadiness on feet (R26.81);Muscle weakness (generalized) (M62.81);Difficulty in walking, not elsewhere classified (R26.2)    Time: 1510-1540 PT Time Calculation (min) (ACUTE ONLY): 30 min   Charges:   PT Evaluation $PT Eval Moderate Complexity: 1 Mod PT Treatments $Gait Training: 8-22 mins   PT G Codes:        Reina Fuserin Jemuel Laursen, SPT  Reina Fuserin Lysandra Loughmiller 02/14/2017, 4:22 PM

## 2017-02-14 NOTE — Progress Notes (Signed)
Family Medicine Teaching Service Daily Progress Note Intern Pager: [phone]  Patient name: Charles Strickland Medical record number: [id number] Date of birth: [birth year] Age: 83 y.o. Gender: male  Primary Care Provider: Maple HudsonGilbert, Richard L Jr., MD Consultants: None Code Status: Partial (Does not want Cardiac Resuscitation but would want intubation/respiratory support in case of an emergency)  Pt Overview and Major Events to Date:  11/17 - admitted for falls, unsteadiness  Assessment and Plan: Charles Strickland is a 83 y.o. male presenting with 2 falls x1d and resulting head laceration. PMH is significant for aortic stenosis s/p bovine valve replacement in 2012, carotid artery occlusion, HLD, HTN, and OA.   Falls: s/p forward fall yesterday morning after standing up from a curb with no LOC and head laceration repaired in the ED. Differentials include vasovagal vs. old stroke although CT head in ED showed no acute intracranial abnormalities (did show atrophy with chronic small vessel ischemia and "chronic right-sided thalamic, caudate, and internal capsule lacunar infarcts"). With no new neurological deficits, new stroke is less likely. U/A negative for infection. CXR with mild cardiomegaly, minimal L effusion and L base atelectasis. HbA1c 5.5. - carotid dopplers given history of occlusion - ECHO given history of aortic stenosis.  - Up with assistance - Consider Neurology consult, though patient without new deficits and CT head without acute ischemic changes - PT/OT consult - Obtain orthostatic vital signs - I/Os - Consider MR/MRA brain to complete stroke w/o, although likely not needed as no new neuro deficits or LOC  Head Laceration: Deep to periosteum at one point. S/p lac repair and Tdap, Ancef 2g in ED.  Sutures in place with well approximated wound. Dressing applied. Hgb 10.9 > 10.3, ~12 at baseline. CT c-spine showed cervical spondylosis without acute cervical spine fracture. - tylenol prn   - ice prn  Aortic stenosis: Cardiologist [place] Dr. [place]Fath in [place]. Per medical record, "underwent aortic valve replacement for calcific aortic stenosis as well as coronary artery bypass grafting with a left internal mammary to the LAD and a saphenous vein graft to the right PDA" in 2012.  - f/u ECHO  HTN: On amlodipine 5 mg, metoprolol tartrate 50 mg BID, losartan-hctz 100-25 mg daily, benazepril 40 mg daily. BP wnl. - Will hold losartan-hctz and benazepril in setting of AKI - continue amlodipine, metoprolol  AKI: SCr 1.40 > 1.14; baseline ~1.00. Suspect inadequate water intake due to slightly dry MM, though BUN:Cr < 20.  - holding ACEi and ARB  Dyspnea: Takes albuterol and dulera prn at home. Unsure of any diagnosis (COPD vs asthma). O2 saturation 100% on room air. CXR with mild cardiomegaly, minimal L effusion and L base atelectasis.  History of hypothyroidism: Not on medication;TSH 2.083.   Hypokalemia: K 3.1 > 2.9. Mg 1.4 - kdur 40 - Monitor with BMP  FEN/GI: heart healthy diet Prophylaxis: lovenox  Disposition: continue inpatient management/workup of falls  Subjective:  Patient feels ok today, denies pain.   Objective: Temp:  [97.9 F (36.6 C)-98.7 F (37.1 C)] 98.7 F (37.1 C) (11/18 0520) Pulse Rate:  [78-94] 78 (11/18 0959) Resp:  [16-22] 18 (11/17 2310) BP: (123-150)/(61-79) 132/61 (11/18 0959) SpO2:  [95 %-100 %] 98 % (11/18 0813) Weight:  [153 lb (69.4 kg)-168 lb (76.2 kg)] 153 lb (69.4 kg) (11/17 2310)  Physical Exam: General: pleasant male lying in bed, in NAD HEENT: sutures in place with well approximated wound. Dressing applied Cardiovascular: RRR, systolic murmur. No LE edema Respiratory: CTA bilaterally, faint bibasilar crackles Abdomen:  soft, nontender to palpation, non distended. MSK: Strength 4/5 to L U/LE bilaterally, 5/5 R U/LE bilaterally. Warm and well perfused. Neuro: Alert and oriented, speech normal. Optic field normal. Extraocular  movements intact. Intact symmetric sensation to light touch of face and extremities bilaterally. Hearing loss present bilaterally, and at baseline. No facial droop noted. Shoulder shrug. Finger to nose normal.  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 Heart is mildly enlarged. Improving aeration at the left base with minimal residual left effusion and left base atelectasis. Right lung is clear. Aortic atherosclerosis. IMPRESSION: Improving aeration at the left base with decreasing left effusion and atelectasis. Electronically Signed   By: Charlett NoseKevin  Dover M.D.   On: 02/14/2017 08:53   Ct Head Wo Contrast  Result Date: 02/13/2017 CLINICAL DATA:  Laceration to head after fall today. EXAM: CT HEAD WITHOUT CONTRAST CT CERVICAL SPINE WITHOUT CONTRAST TECHNIQUE: Multidetector CT imaging of the head and cervical spine was performed following the standard protocol without intravenous contrast. Multiplanar CT image reconstructions of the cervical spine were also generated. COMPARISON:  None. FINDINGS: CT HEAD FINDINGS Brain: Sulcal and ventricular prominence consistent with atrophy. Chronic right-sided thalamic, caudate, and internal capsule lacunar infarcts with chronic appearing small vessel ischemic disease of periventricular and deep white matter.  Vascular: No hyperdense vessel or unexpected calcification. Skull: No skull fracture. Sinuses/Orbits: Bilateral lens replacements. Intact orbits and globes. No acute sinus disease. Other: Bilateral mastoid effusions with near complete opacification of the right mastoid and opacification of the left mastoid. Scalp hematoma and contusion overlying the left frontoparietal skull with soft tissue lacerations. CT CERVICAL SPINE FINDINGS Alignment: Intact craniocervical relationship. Osteoarthritis of the atlantodental interval with joint space narrowing. No acute fracture. Mild grade 1 anterolisthesis of C4 on C5 and C5 on C6. Skull base and vertebrae: No acute fracture. No primary bone lesion or focal pathologic process. Soft tissues and spinal canal: No prevertebral fluid or swelling. No visible canal hematoma. Disc levels: Cervical spondylosis with multilevel disc space narrowing. Vacuum disc phenomenon noted C5-6 and C6-7. Multilevel degenerative facet arthropathy with joint space narrowing, facet hypertrophy and sclerosis. Uncovertebral joint osteoarthritis is noted on the right at C3-4, bilaterally from C4 through T1. Upper chest: Scarring at the apices. Other: Nuchal ligament ossifications noted. Bilateral extracranial carotid arteriosclerosis. IMPRESSION: 1. Soft tissue swelling of the scalp with associated lacerations and contusions overlying the left frontoparietal skull. No underlying skull fracture. 2. Atrophy with chronic small vessel ischemia. No acute intracranial abnormality. 3. Cervical spondylosis without acute cervical spine  fracture. Electronically Signed   By: Tollie Eth M.D.   On: 02/13/2017 19:14   Ct Cervical Spine Wo Contrast  Result Date: 02/13/2017 CLINICAL DATA:  Laceration to head after fall today. EXAM: CT HEAD WITHOUT CONTRAST CT CERVICAL SPINE WITHOUT CONTRAST TECHNIQUE: Multidetector CT imaging of the head and cervical spine was performed following the standard protocol without  intravenous contrast. Multiplanar CT image reconstructions of the cervical spine were also generated. COMPARISON:  None. FINDINGS: CT HEAD FINDINGS Brain: Sulcal and ventricular prominence consistent with atrophy. Chronic right-sided thalamic, caudate, and internal capsule lacunar infarcts with chronic appearing small vessel ischemic disease of periventricular and deep white matter. Vascular: No hyperdense vessel or unexpected calcification. Skull: No skull fracture. Sinuses/Orbits: Bilateral lens replacements. Intact orbits and globes. No acute sinus disease. Other: Bilateral mastoid effusions with near complete opacification of the right mastoid and opacification of the left mastoid. Scalp hematoma and contusion overlying the left frontoparietal skull with soft tissue lacerations. CT CERVICAL SPINE FINDINGS Alignment: Intact craniocervical relationship. Osteoarthritis of the atlantodental interval with joint space narrowing. No acute fracture. Mild grade 1 anterolisthesis of C4 on C5 and C5 on C6. Skull base and vertebrae: No acute fracture. No primary bone lesion or focal pathologic process. Soft tissues and spinal canal: No prevertebral fluid or swelling. No visible canal hematoma. Disc levels: Cervical spondylosis with multilevel disc space narrowing. Vacuum disc phenomenon noted C5-6 and C6-7. Multilevel degenerative facet arthropathy with joint space narrowing, facet hypertrophy and sclerosis. Uncovertebral joint osteoarthritis is noted on the right at C3-4, bilaterally from C4 through T1. Upper chest: Scarring at the apices. Other: Nuchal ligament ossifications noted. Bilateral extracranial carotid arteriosclerosis. IMPRESSION: 1. Soft tissue swelling of the scalp with associated lacerations and contusions overlying the left frontoparietal skull. No underlying skull fracture. 2. Atrophy with chronic small vessel ischemia. No acute intracranial abnormality. 3. Cervical spondylosis without acute cervical spine  fracture. Electronically Signed   By: Tollie Eth M.D.   On: 02/13/2017 19:14   Ellwood Dense, DO 02/14/2017, 10:21 AM PGY-1, [facility] FPTS Intern pager: [phone], text pages welcome

## 2017-02-14 NOTE — Discharge Summary (Signed)
Family Medicine Teaching [facility] Discharge Summary  Patient name: Collene Maresheodore Bertini Medical record number: [id number] Date of birth: [birth year] Age: 83 y.o. Gender: male Date of Admission: 02/13/2017  Date of Discharge: 02/16/2017 Admitting Physician: Nestor RampSara L Neal, MD  Primary Care Provider: Maple HudsonGilbert, Richard L Jr., MD Consultants: None  Indication for Hospitalization: falls, unsteadiness  Discharge Diagnoses/Problem List:  Falls, stable Head Laceration, improved Aortic stenosis, stable HTN, stable AKI, improved Dyspnea, stable Hypokalemia, resolved  Disposition: Home  Discharge Condition: Improved  Discharge Exam:  General: pleasant male sitting in bed, in NAD HEENT: sutures in place with well approximated wound. Dressing applied. Bruising noted over R and L eyes. Cardiovascular: RRR, systolic murmur. No LE edema Respiratory: CTA bilaterally, no crackles/wheezing appreciated. Abdomen: soft, nontender to palpation, non distended. Neuro:Alert and oriented, speech normal.   Brief Hospital Course:  Hinton Raoheodore Bowenis a 83 y.o.male with PMH significant for aortic stenosis s/p bovine valve replacement in 2012, carotid artery occlusion, HLD, HTN, and OApresenting with 2 falls x1d and resulting head laceration repaired in the ED.S/p Tdap and 2g Ancef. Stroke work up was completed due to risk factors of HLD, carotid artery disease, HTN, remote smoking history and included CT Head which resulted with no acute findings. Patient does have L sided weakness but is not new per the patient and daughter, likely from old stroke as evidenced on CT Head. Carotid U/S unchanged from prior.  Orthostatics were also obtained which were negative. ECHO showed worsened aortic stenosis. BP remained normotensive with holding home ACEI and ARB due to AKI which was improved prior to discharge. Fall likely multifactorial due to balance issues and stressful situation.  Issues for Follow Up:  1. Medication  Changes: 1. On ACEI/ARB at home. Holding home Hyzaar with normal BP. 2. Discontinued claritin due to recent falls. 3. Consider discontinuing pravastatin given his age. 2. Patient's Hb 9.6 at discharge, likely related to blood loss of head wound. Patient should have repeat CBC in one week to check Hb. 3. Scalp sutures should be kept clean and dry and superficial sutures removed in 8 days (were placed on 11/17). 4. ECHO showed worsening aortic stenosis. With patient's ongoing dyspnea, recommend Cardiology follow up with patient's outpatient Cardiologist, Dr. Lady GaryFath. 5. Patient will be getting Home Health PT, OT, nursing.  Significant Procedures: Head laceration repair with tendon and scalp approximation  Significant Labs and Imaging:  Recent Labs  Lab 02/14/17 0241 02/15/17 0307 02/16/17 0354  WBC 12.7* 10.3 8.6  HGB 10.3* 9.8* 9.6*  HCT 29.9* 28.9* 28.5*  PLT 164 158 152   Recent Labs  Lab 02/13/17 1807 02/13/17 1826 02/14/17 0241 02/14/17 1429 02/15/17 0307 02/16/17 0354 02/16/17 1004  NA 138 140 137 138 136 137  --   K 3.1* 3.1* 2.9* 3.3* 4.2 3.9  --   CL 105 102 103 105 107 104  --   CO2 26  --  25 26 24 25   --   GLUCOSE 111* 111* 119* 128* 91 92  --   BUN 18 19 16 18 19 19   --   CREATININE 1.40* 1.40* 1.14 1.29* 1.14 0.94  --   CALCIUM 9.6  --  9.1 9.2 9.1 9.1  --   MG  --   --  1.4*  --  1.8  --  1.6*  ALKPHOS 79  --   --   --   --   --   --   AST 36  --   --   --   --   --   --  ALT 19  --   --   --   --   --   --   ALBUMIN 3.7  --   --   --   --   --   --     Dg Hand Complete Left  Result Date: 02/15/2017 CLINICAL DATA:  83 year old male with fall and trauma to the left hand. EXAM: LEFT HAND - COMPLETE 3+ VIEW COMPARISON:  None. FINDINGS: There is no acute fracture or dislocation. The bones are osteopenic. The soft tissues appear unremarkable. IMPRESSION: 1. No acute fracture or dislocation. 2. Osteopenia. Electronically Signed   By: Elgie Collard M.D.   On:  02/15/2017 23:42   Results/Tests Pending at Time of Discharge: None  Discharge Medications:  Allergies as of 02/16/2017      Reactions   Sulfa Antibiotics Rash      Medication List    STOP taking these medications   loratadine 10 MG tablet Commonly known as:  CLARITIN   losartan-hydrochlorothiazide 100-25 MG tablet Commonly known as:  HYZAAR   metoprolol succinate 50 MG 24 hr tablet Commonly known as:  TOPROL-XL     TAKE these medications   amLODipine 5 MG tablet Commonly known as:  NORVASC Take 1 tablet (5 mg total) by mouth daily.   aspirin EC 81 MG tablet Take 81 mg daily by mouth.   benazepril 40 MG tablet Commonly known as:  LOTENSIN Take 40 mg daily by mouth.   cholecalciferol 1000 units tablet Commonly known as:  VITAMIN D Take 1,000 Units by mouth daily.   fluticasone 50 MCG/ACT nasal spray Commonly known as:  FLONASE Place 1 spray daily as needed into both nostrils for allergies or rhinitis.   LAXATIVE PO Take 1 tablet 2 (two) times daily by mouth.   metoprolol tartrate 50 MG tablet Commonly known as:  LOPRESSOR Take 50 mg 2 (two) times daily by mouth.   mometasone-formoterol 200-5 MCG/ACT Aero Commonly known as:  DULERA Inhale 2 puffs into the lungs 2 (two) times daily.   multivitamin with minerals Tabs tablet Take 1 tablet See admin instructions by mouth. Take the contents of [employer] Whole Body Vitapak by mouth every morning   pravastatin 20 MG tablet Commonly known as:  PRAVACHOL Take 1 tablet (20 mg total) by mouth daily. What changed:  when to take this   tamsulosin 0.4 MG Caps capsule Commonly known as:  FLOMAX Take 2 tablets daily What changed:    how much to take  how to take this  when to take this  additional instructions   VENTOLIN HFA 108 (90 Base) MCG/ACT inhaler Generic drug:  albuterol Inhale 2 puffs 3 (three) times daily as needed into the lungs for wheezing or shortness of breath.   vitamin B-12 500 MCG  tablet Commonly known as:  CYANOCOBALAMIN Take 500 mcg by mouth daily.       Discharge Instructions: Please refer to Patient Instructions section of EMR for full details.  Patient was counseled important signs and symptoms that should prompt return to medical care, changes in medications, dietary instructions, activity restrictions, and follow up appointments.   Follow-Up Appointments: Follow-up Information    [facility] Follow up.   Specialty:  Home Health Services Why:  Home health services arranged, office will call and set up home visits Contact information: [address] [place] [address] [phone]        Dalia Heading, MD Follow up on 02/26/2017.   Specialty:  Cardiology Why:  11/30 at 1:30pm Contact information: [address] [facility] [place] [id number] [phone]           Ellwood DenseRumball, Mindi Akerson, DO 02/16/2017, 3:37 PM PGY-1, [facility]

## 2017-02-15 ENCOUNTER — Inpatient Hospital Stay (HOSPITAL_COMMUNITY): Payer: Medicare Other

## 2017-02-15 ENCOUNTER — Encounter (HOSPITAL_COMMUNITY): Payer: Self-pay | Admitting: Family Medicine

## 2017-02-15 DIAGNOSIS — I351 Nonrheumatic aortic (valve) insufficiency: Secondary | ICD-10-CM

## 2017-02-15 DIAGNOSIS — S0093XA Contusion of unspecified part of head, initial encounter: Secondary | ICD-10-CM | POA: Diagnosis present

## 2017-02-15 DIAGNOSIS — S6992XA Unspecified injury of left wrist, hand and finger(s), initial encounter: Secondary | ICD-10-CM

## 2017-02-15 DIAGNOSIS — M79642 Pain in left hand: Secondary | ICD-10-CM

## 2017-02-15 DIAGNOSIS — R0609 Other forms of dyspnea: Secondary | ICD-10-CM

## 2017-02-15 DIAGNOSIS — S0003XD Contusion of scalp, subsequent encounter: Secondary | ICD-10-CM

## 2017-02-15 DIAGNOSIS — I350 Nonrheumatic aortic (valve) stenosis: Secondary | ICD-10-CM

## 2017-02-15 DIAGNOSIS — W19XXXA Unspecified fall, initial encounter: Secondary | ICD-10-CM

## 2017-02-15 DIAGNOSIS — R0600 Dyspnea, unspecified: Secondary | ICD-10-CM

## 2017-02-15 DIAGNOSIS — R296 Repeated falls: Secondary | ICD-10-CM

## 2017-02-15 DIAGNOSIS — W19XXXD Unspecified fall, subsequent encounter: Secondary | ICD-10-CM

## 2017-02-15 DIAGNOSIS — R55 Syncope and collapse: Secondary | ICD-10-CM | POA: Diagnosis present

## 2017-02-15 DIAGNOSIS — S0101XD Laceration without foreign body of scalp, subsequent encounter: Secondary | ICD-10-CM

## 2017-02-15 DIAGNOSIS — S0101XA Laceration without foreign body of scalp, initial encounter: Secondary | ICD-10-CM

## 2017-02-15 DIAGNOSIS — T82857A Stenosis of cardiac prosthetic devices, implants and grafts, initial encounter: Secondary | ICD-10-CM

## 2017-02-15 DIAGNOSIS — R2689 Other abnormalities of gait and mobility: Principal | ICD-10-CM

## 2017-02-15 HISTORY — DX: Repeated falls: R29.6

## 2017-02-15 HISTORY — DX: Dyspnea, unspecified: R06.00

## 2017-02-15 HISTORY — DX: Stenosis of other cardiac prosthetic devices, implants and grafts, initial encounter: T82.857A

## 2017-02-15 HISTORY — DX: Unspecified injury of left wrist, hand and finger(s), initial encounter: S69.92XA

## 2017-02-15 HISTORY — DX: Other forms of dyspnea: R06.09

## 2017-02-15 LAB — CBC
HEMATOCRIT: 28.9 % — AB (ref 39.0–52.0)
HEMOGLOBIN: 9.8 g/dL — AB (ref 13.0–17.0)
MCH: 28.9 pg (ref 26.0–34.0)
MCHC: 33.9 g/dL (ref 30.0–36.0)
MCV: 85.3 fL (ref 78.0–100.0)
Platelets: 158 10*3/uL (ref 150–400)
RBC: 3.39 MIL/uL — ABNORMAL LOW (ref 4.22–5.81)
RDW: 13.6 % (ref 11.5–15.5)
WBC: 10.3 10*3/uL (ref 4.0–10.5)

## 2017-02-15 LAB — BASIC METABOLIC PANEL
ANION GAP: 5 (ref 5–15)
BUN: 19 mg/dL (ref 6–20)
CHLORIDE: 107 mmol/L (ref 101–111)
CO2: 24 mmol/L (ref 22–32)
Calcium: 9.1 mg/dL (ref 8.9–10.3)
Creatinine, Ser: 1.14 mg/dL (ref 0.61–1.24)
GFR calc Af Amer: >60 mL/min (ref >60)
GFR, EST NON AFRICAN AMERICAN: 53 mL/min — AB (ref >60)
GLUCOSE: 91 mg/dL (ref 65–99)
POTASSIUM: 4.2 mmol/L (ref 3.5–5.1)
SODIUM: 136 mmol/L (ref 135–145)

## 2017-02-15 LAB — ECHOCARDIOGRAM COMPLETE
Height: 67.5 in
WEIGHTICAEL: 2448 [oz_av]

## 2017-02-15 LAB — MAGNESIUM: Magnesium: 1.8 mg/dL (ref 1.7–2.4)

## 2017-02-15 NOTE — Progress Notes (Signed)
Physical Therapy Treatment Patient Details Name: Charles Strickland MRN: [id number] DOB: 14-Sep-1920 Today's Date: 02/15/2017    History of Present Illness 83 y.o. male presenting after fall with laceration to head. head CT negative. PMH significant of aortic stenosis s/p bovine valve replacement, CAD, HLD, HTN, OA, and hearing loss.     PT Comments    Pt continues to demonstrate impaired mobility, decreased balance, and generalized muscular weakness secondary to above. Pt tolerates treatment well with no c/o pain. Pt ambulates, but only from bed to chair as he indicates he would like to eat his breakfast. Pt's BP more stable today as compared to yesterday. Pt remains good candidate for skilled PT in order to improve upon aforementioned deficits and increase level of independence.   BP supine: 142/58, BP seated EOB: 135/69, BP standing: 128/59   Follow Up Recommendations  Home health PT;Supervision/Assistance - 24 hour     Equipment Recommendations  None recommended by PT    Recommendations for Other Services       Precautions / Restrictions Precautions Precautions: Fall Restrictions Weight Bearing Restrictions: No    Mobility  Bed Mobility Overal bed mobility: Needs Assistance Bed Mobility: Supine to Sit     Supine to sit: Min guard;HOB elevated     General bed mobility comments: Pt requiring increased time for bed mobility.   Transfers Overall transfer level: Needs assistance Equipment used: Rolling walker (2 wheeled) Transfers: Sit to/from Stand Sit to Stand: Min guard Stand pivot transfers: Supervision       General transfer comment: sit-to-stand x1. Pt requires increased time for sit-to-stand.   Ambulation/Gait Ambulation/Gait assistance: Min guard Ambulation Distance (Feet): 5 Feet Assistive device: Rolling walker (2 wheeled) Gait Pattern/deviations: Step-through pattern;Trunk flexed Gait velocity: decreased       Stairs            Wheelchair  Mobility    Modified Rankin (Stroke Patients Only)       Balance Overall balance assessment: Needs assistance Sitting-balance support: No upper extremity supported;Feet supported Sitting balance-Leahy Scale: Fair Sitting balance - Comments: Pt able to sit EOB with UEs in lap.   Standing balance support: Bilateral upper extremity supported;During functional activity Standing balance-Leahy Scale: Poor Standing balance comment: Pt maintains BUEs on RW during standing to maintain balance.                             Cognition Arousal/Alertness: Lethargic Behavior During Therapy: WFL for tasks assessed/performed Overall Cognitive Status: Within Functional Limits for tasks assessed                                        Exercises      General Comments General comments (skin integrity, edema, etc.): BP supine: 142/58. BP seated EOB: 135/69, BP standing 128/59      Pertinent Vitals/Pain Pain Assessment: No/denies pain Pain Score: 6  Pain Location: Lt hand  Pain Descriptors / Indicators: Aching Pain Intervention(s): Monitored during session    Home Living Family/patient expects to be discharged to:: Private residence Living Arrangements: Alone Available Help at Discharge: Family;Available PRN/intermittently Type of Home: House Home Access: Stairs to enter Entrance Stairs-Rails: Left Home Layout: Laundry or work area in basement;Able to live on main level with bedroom/bathroom Home Equipment: Grab bars - tub/shower;Walker - 2 wheels;Walker - 4 wheels;Crutches;Shower seat;Cane - single point;Wheelchair - manual  Additional Comments: Pt uses cane when in community. Pt is furniture walker in home environment.     Prior Function Level of Independence: Independent with assistive device(s)      Comments: daughter assists with medication management    PT Goals (current goals can now be found in the care plan section) Acute Rehab PT Goals Patient Stated  Goal: to return home PT Goal Formulation: With patient Time For Goal Achievement: 02/28/17 Potential to Achieve Goals: Good Progress towards PT goals: Progressing toward goals    Frequency    Min 3X/week      PT Plan Current plan remains appropriate    Co-evaluation              AM-PAC PT "6 Clicks" Daily Activity  Outcome Measure  Difficulty turning over in bed (including adjusting bedclothes, sheets and blankets)?: A Little Difficulty moving from lying on back to sitting on the side of the bed? : A Little Difficulty sitting down on and standing up from a chair with arms (e.g., wheelchair, bedside commode, etc,.)?: A Little Help needed moving to and from a bed to chair (including a wheelchair)?: A Little Help needed walking in hospital room?: A Little Help needed climbing 3-5 steps with a railing? : A Lot 6 Click Score: 17    End of Session Equipment Utilized During Treatment: Gait belt Activity Tolerance: Patient tolerated treatment well Patient left: with call bell/phone within reach;in chair;with chair alarm set;with family/visitor present Nurse Communication: Mobility status PT Visit Diagnosis: Unsteadiness on feet (R26.81);Muscle weakness (generalized) (M62.81);Difficulty in walking, not elsewhere classified (R26.2)     Time: [id number] PT Time Calculation (min) (ACUTE ONLY): 15 min  Charges:  $Therapeutic Activity: 8-22 mins                    G Codes:       Reina Fuserin Remy Voiles, SPT   Reina Fuserin Ivionna Verley 02/15/2017, 12:53 PM

## 2017-02-15 NOTE — Evaluation (Signed)
Occupational Therapy Evaluation Patient Details Name: Charles Strickland MRN: [id number] DOB: 22-Oct-1920 Today's Date: 02/15/2017    History of Present Illness 83 y.o. male presenting after fall with laceration to head. head CT negative. PMH significant of aortic stenosis s/p bovine valve replacement, CAD, HLD, HTN, OA, and hearing loss.    Clinical Impression   Pt admitted with above.  He demonstrates mild balance deficits.   No visual deficits were noted this date.  He lives alone, with intermittent support from daughters, and continues to drive.  Recommend HHOT to ensure he is back to baseline.     Follow Up Recommendations  Home health OT;Supervision/Assistance - 24 hour    Equipment Recommendations  None recommended by OT    Recommendations for Other Services       Precautions / Restrictions Precautions Precautions: Fall Restrictions Weight Bearing Restrictions: No      Mobility Bed Mobility Overal bed mobility: Needs Assistance Bed Mobility: Supine to Sit     Supine to sit: Min guard;HOB elevated     General bed mobility comments: Pt sitting in recliner   Transfers Overall transfer level: Needs assistance Equipment used: Rolling walker (2 wheeled) Transfers: Sit to/from UGI CorporationStand;Stand Pivot Transfers Sit to Stand: Supervision Stand pivot transfers: Supervision       General transfer comment: sit-to-stand x1. Pt requires increased time for sit-to-stand.     Balance Overall balance assessment: Needs assistance Sitting-balance support: No upper extremity supported;Feet supported Sitting balance-Leahy Scale: Good Sitting balance - Comments: Pt able to sit EOB with UEs in lap.   Standing balance support: No upper extremity supported Standing balance-Leahy Scale: Fair Standing balance comment: Pt maintains BUEs on RW during standing to maintain balance.                            ADL either performed or assessed with clinical judgement   ADL  Overall ADL's : Needs assistance/impaired Eating/Feeding: Independent   Grooming: Wash/dry hands;Wash/dry face;Brushing hair;Oral care;Supervision/safety;Standing   Upper Body Bathing: Set up;Sitting   Lower Body Bathing: Supervison/ safety;Sit to/from stand   Upper Body Dressing : Supervision/safety;Sitting   Lower Body Dressing: Supervision/safety;Sit to/from stand   Toilet Transfer: Supervision/safety;Ambulation;Comfort height toilet;Grab bars;RW   Toileting- Clothing Manipulation and Hygiene: Supervision/safety;Sit to/from stand   Tub/ Shower Transfer: Tub transfer;Supervision/safety;Ambulation;Shower seat;Grab bars;Rolling walker   Functional mobility during ADLs: Supervision/safety General ADL Comments: Encouraged pt to sit to shower.  Also dicussed that pt should not attempt laundry in basement until [facility]      Vision Baseline Vision/History: Wears glasses Wears Glasses: At all times Patient Visual Report: No change from baseline Vision Assessment?: Yes Eye Alignment: Within Functional Limits Ocular Range of Motion: Within Functional Limits Alignment/Gaze Preference: Within Defined Limits Tracking/Visual Pursuits: Able to track stimulus in all quads without difficulty Visual Fields: No apparent deficits Additional Comments: Pt able to read signs in hallway without difficulty.  Was able to negotiate obstacles without difficulty      Perception Perception Perception Tested?: Yes   Praxis Praxis Praxis tested?: Within functional limits    Pertinent Vitals/Pain Pain Assessment: 0-10 Pain Score: 6  Pain Location: Lt hand  Pain Descriptors / Indicators: Aching Pain Intervention(s): Monitored during session     Hand Dominance Right   Extremity/Trunk Assessment Upper Extremity Assessment Upper Extremity Assessment: LUE deficits/detail LUE Deficits / Details: strength shoulder and elbow 5/5, with hand discomfort and limitations with  flexion  and extension of third digit, however, he is unsure that he was able to fully extend digit previously.  As session progressed, and pt performed AROM, his ability to make full fist improved, and pt reported less pain by end of session - MD was paged and made aware  LUE Coordination: decreased fine motor   Lower Extremity Assessment Lower Extremity Assessment: Defer to PT evaluation   Cervical / Trunk Assessment Cervical / Trunk Assessment: Kyphotic   Communication Communication Communication: HOH   Cognition Arousal/Alertness: Awake/alert Behavior During Therapy: WFL for tasks assessed/performed Overall Cognitive Status: Within Functional Limits for tasks assessed                                     General Comments  daughter's present     Exercises     Shoulder Instructions      Home Living Family/patient expects to be discharged to:: Private residence Living Arrangements: Alone Available Help at Discharge: Family;Available PRN/intermittently Type of Home: House Home Access: Stairs to enter Entrance Stairs-Number of Steps: 5 Entrance Stairs-Rails: Left Home Layout: Laundry or work area in basement;Able to live on main level with bedroom/bathroom     Bathroom Shower/Tub: Chief Strategy OfficerTub/shower unit   Bathroom Toilet: Standard     Home Equipment: Grab bars - tub/shower;Walker - 2 wheels;Walker - 4 wheels;Crutches;Shower seat;Cane - single point;Wheelchair - manual   Additional Comments: Pt uses cane when in community. Pt is furniture walker in home environment.   Lives With: Alone    Prior Functioning/Environment Level of Independence: Independent with assistive device(s)        Comments: daughter assists with medication management         OT Problem List: Impaired balance (sitting and/or standing)      OT Treatment/Interventions:      OT Goals(Current goals can be found in the care plan section) Acute Rehab OT Goals Patient Stated Goal: to  return home OT Goal Formulation: All assessment and education complete, DC therapy  OT Frequency:     Barriers to D/C:            Co-evaluation              AM-PAC PT "6 Clicks" Daily Activity     Outcome Measure Help from another person eating meals?: None Help from another person taking care of personal grooming?: A Little Help from another person toileting, which includes using toliet, bedpan, or urinal?: A Little Help from another person bathing (including washing, rinsing, drying)?: A Little Help from another person to put on and taking off regular upper body clothing?: A Little Help from another person to put on and taking off regular lower body clothing?: A Little 6 Click Score: 19   End of Session Equipment Utilized During Treatment: Rolling walker Nurse Communication: Mobility status  Activity Tolerance: Patient tolerated treatment well Patient left: in chair;with call bell/phone within reach;with chair alarm set;with family/visitor present  OT Visit Diagnosis: Unsteadiness on feet (R26.81)                Time: [id number] OT Time Calculation (min): 48 min Charges:  OT General Charges $OT Visit: 1 Visit OT Evaluation $OT Eval Moderate Complexity: 1 Mod OT Treatments $Self Care/Home Management : 8-22 mins $Therapeutic Activity: 8-22 mins G-Codes:     Reynolds AmericanWendi Kaely Hollan, OTR/L [phone]   Jeani HawkingConarpe, Charles Strickland 02/15/2017, 11:19 AM

## 2017-02-15 NOTE — Progress Notes (Signed)
  Echocardiogram 2D Echocardiogram has been performed.  Nolon RodBrown, Tony 02/15/2017, 1:01 PM

## 2017-02-15 NOTE — Progress Notes (Signed)
Family Medicine Teaching Service Daily Progress Note Intern Pager: [phone]  Patient name: Charles Strickland Medical record number: [id number] Date of birth: Oct 25, 1920 Age: 83 y.o. Gender: male  Primary Care Provider: Maple HudsonGilbert, Richard L Jr., MD Consultants: None Code Status: Partial (Does not want Cardiac Resuscitation but would want intubation/respiratory support in case of an emergency)  Pt Overview and Major Events to Date:  11/17 - admitted for falls, unsteadiness  Assessment and Plan: Charles Strickland is a 83 y.o. male presenting with 2 falls x1d and resulting head laceration. PMH is significant for aortic stenosis s/p bovine valve replacement in 2012, carotid artery occlusion, HLD, HTN, and OA.   Falls: s/p forward fall yesterday morning after tripping over a curb with no LOC and head laceration repaired in the ED. It was initially thought that he fell from standing and began syncope/stroke workup. CT head in ED showed no acute intracranial abnormalities (did show atrophy with chronic small vessel ischemia and "chronic right-sided thalamic, caudate, and internal capsule lacunar infarcts"). CXR with mild cardiomegaly, minimal L effusion and L base atelectasis. With no new neurological deficits, and clarification on events surrounding fall, convincing for mechanical etiology. Carotid U/S done yesterday with preliminary read R 1-39% stenosis, 40-59% L ICA stenosis, unchanged from prior. Orthostatics negative. With largely negative workup, can likely d/c home today after ECHO. - carotid dopplers given history of occlusion - ECHO given history of aortic stenosis.  - Up with assistance - PT/OT consult - recommend HH - I/Os  Head Laceration: Deep to periosteum at one point. S/p lac repair and Tdap, Ancef 2g in ED.  Sutures in place with well approximated wound. Dressing applied. Hgb 10.9 > 9.8, ~12 at baseline. Drop is likely from blood loss 2/2 head laceration. Not symptomatic. CT c-spine showed  cervical spondylosis without acute cervical spine fracture. - tylenol prn  - ice prn  Aortic stenosis: Cardi[place]. Per medical record, "underwent aortic valve replacement for calcific aortic stenosis as well as coronary artery bypass grafting with a left internal mammary to the LAD and a saphenous vein graft to the right PDA" in 2012.  - f/u ECHO  HTN: On amlodipine 5 mg, metoprolol tartrate 50 mg BID, losartan-hctz 100-25 mg daily, benazepril 40 mg daily. BP wnl. - Will hold losartan-hctz and benazepril in setting of AKI - continue amlodipine, metoprolol  AKI: SCr 1.40 > 1.14; baseline ~1.00. Suspect inadequate water intake due to slightly dry MM, though BUN:Cr < 20.  - holding ACEi and ARB  Dyspnea: Takes albuterol and dulera prn at home. Unsure of any diagnosis (COPD vs asthma). O2 saturation 100% on room air. CXR with mild cardiomegaly, minimal L effusion and L base atelectasis.  Hypokalemia: K 3.1 > 4.2. Mg 1.4>1.8 - replete with kdur 40 as necessary - Monitor with BMP  FEN/GI: heart healthy diet Prophylaxis: lovenox  Disposition: likely d/c today  Subjective:  Patient feeling ok today, complaining of some head pain from wound, encouraged to take PO PRN pain medication although patient stated he doesn't like taking medication.  Objective: Temp:  [98.3 F (36.8 C)-99.2 F (37.3 C)] 98.3 F (36.8 C) (11/19 0438) Pulse Rate:  [67-78] 72 (11/19 0438) Resp:  [16-17] 16 (11/19 0438) BP: (106-132)/(44-69) 123/61 (11/19 0438) SpO2:  [95 %-100 %] 95 % (11/19 0438)  Physical Exam: General: pleasant male lying in bed, in NAD HEENT: sutures in place with well approximated wound. Dressing applied. Bruising noted over L eye. Cardiovascular: RRR, systolic murmur.  No LE edema Respiratory: CTA bilaterally, no crackles/wheezing appreciated. Abdomen: soft, nontender to palpation, non distended. MSK: Strength 4/5 to L U/LE bilaterally, 5/5 R U/LE  bilaterally. Warm and well perfused. Neuro: Alert and oriented, speech normal.   Laboratory: Recent Labs  Lab 02/13/17 1807 02/13/17 1826 02/14/17 0241 02/15/17 0307  WBC 10.9*  --  12.7* 10.3  HGB 11.2* 10.9* 10.3* 9.8*  HCT 31.8* 32.0* 29.9* 28.9*  PLT 188  --  164 158   Recent Labs  Lab 02/13/17 1807  02/14/17 0241 02/14/17 1429 02/15/17 0307  NA 138   < > 137 138 136  K 3.1*   < > 2.9* 3.3* 4.2  CL 105   < > 103 105 107  CO2 26  --  25 26 24   BUN 18   < > 16 18 19   CREATININE 1.40*   < > 1.14 1.29* 1.14  CALCIUM 9.6  --  9.1 9.2 9.1  PROT 6.5  --   --   --   --   BILITOT 0.6  --   --   --   --   ALKPHOS 79  --   --   --   --   ALT 19  --   --   --   --   AST 36  --   --   --   --   GLUCOSE 111*   < > 119* 128* 91   < > = values in this interval not displayed.    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 Heart is mildly enlarged. Improving aeration at the left base with minimal residual left effusion and left base atelectasis. Right lung is clear. Aortic atherosclerosis. IMPRESSION: Improving aeration at the left base with decreasing left effusion and atelectasis. Electronically Signed   By: Charlett NoseKevin  Dover M.D.   On: 02/14/2017 08:53   Ct Head Wo Contrast  Result Date: 02/13/2017 CLINICAL DATA:  Laceration to head after fall today. EXAM: CT HEAD WITHOUT CONTRAST CT CERVICAL SPINE WITHOUT CONTRAST TECHNIQUE: Multidetector CT imaging of the head and cervical spine was performed following the standard protocol without intravenous contrast. Multiplanar CT image reconstructions of the cervical spine were also generated. COMPARISON:  None. FINDINGS: CT HEAD FINDINGS Brain: Sulcal and ventricular prominence consistent with atrophy. Chronic right-sided thalamic, caudate, and internal capsule lacunar infarcts with  chronic appearing small vessel ischemic disease of periventricular and deep white matter. Vascular: No hyperdense vessel or unexpected calcification. Skull: No skull fracture. Sinuses/Orbits: Bilateral lens replacements. Intact orbits and globes. No acute sinus disease. Other: Bilateral mastoid effusions with near complete opacification of the right mastoid and opacification of the left mastoid. Scalp hematoma and contusion overlying the left frontoparietal skull with soft tissue lacerations. CT CERVICAL SPINE FINDINGS Alignment: Intact craniocervical relationship. Osteoarthritis of the atlantodental interval with joint space narrowing. No acute fracture. Mild grade 1 anterolisthesis of C4 on C5 and C5 on C6. Skull base and vertebrae: No acute fracture. No primary bone lesion or focal pathologic process. Soft tissues and spinal canal: No prevertebral fluid or swelling. No visible canal hematoma. Disc levels: Cervical spondylosis with multilevel disc space narrowing. Vacuum disc phenomenon noted C5-6 and C6-7. Multilevel degenerative facet arthropathy with joint space narrowing, facet hypertrophy and sclerosis. Uncovertebral joint osteoarthritis is noted on the right at  C3-4, bilaterally from C4 through T1. Upper chest: Scarring at the apices. Other: Nuchal ligament ossifications noted. Bilateral extracranial carotid arteriosclerosis. IMPRESSION: 1. Soft tissue swelling of the scalp with associated lacerations and contusions overlying the left frontoparietal skull. No underlying skull fracture. 2. Atrophy with chronic small vessel ischemia. No acute intracranial abnormality. 3. Cervical spondylosis without acute cervical spine fracture. Electronically Signed   By: Tollie Eth M.D.   On: 02/13/2017 19:14   Ct Cervical Spine Wo Contrast  Result Date: 02/13/2017 CLINICAL DATA:  Laceration to head after fall today. EXAM: CT HEAD WITHOUT CONTRAST CT CERVICAL SPINE WITHOUT CONTRAST TECHNIQUE: Multidetector CT imaging  of the head and cervical spine was performed following the standard protocol without intravenous contrast. Multiplanar CT image reconstructions of the cervical spine were also generated. COMPARISON:  None. FINDINGS: CT HEAD FINDINGS Brain: Sulcal and ventricular prominence consistent with atrophy. Chronic right-sided thalamic, caudate, and internal capsule lacunar infarcts with chronic appearing small vessel ischemic disease of periventricular and deep white matter. Vascular: No hyperdense vessel or unexpected calcification. Skull: No skull fracture. Sinuses/Orbits: Bilateral lens replacements. Intact orbits and globes. No acute sinus disease. Other: Bilateral mastoid effusions with near complete opacification of the right mastoid and opacification of the left mastoid. Scalp hematoma and contusion overlying the left frontoparietal skull with soft tissue lacerations. CT CERVICAL SPINE FINDINGS Alignment: Intact craniocervical relationship. Osteoarthritis of the atlantodental interval with joint space narrowing. No acute fracture. Mild grade 1 anterolisthesis of C4 on C5 and C5 on C6. Skull base and vertebrae: No acute fracture. No primary bone lesion or focal pathologic process. Soft tissues and spinal canal: No prevertebral fluid or swelling. No visible canal hematoma. Disc levels: Cervical spondylosis with multilevel disc space narrowing. Vacuum disc phenomenon noted C5-6 and C6-7. Multilevel degenerative facet arthropathy with joint space narrowing, facet hypertrophy and sclerosis. Uncovertebral joint osteoarthritis is noted on the right at C3-4, bilaterally from C4 through T1. Upper chest: Scarring at the apices. Other: Nuchal ligament ossifications noted. Bilateral extracranial carotid arteriosclerosis. IMPRESSION: 1. Soft tissue swelling of the scalp with associated lacerations and contusions overlying the left frontoparietal skull. No underlying skull fracture. 2. Atrophy with chronic small vessel ischemia. No  acute intracranial abnormality. 3. Cervical spondylosis without acute cervical spine fracture. Electronically Signed   By: Tollie Eth M.D.   On: 02/13/2017 19:14   Ellwood Dense, DO 02/15/2017, 6:54 AM PGY-1, [facility] FPTS Intern pager: [phone], text pages welcome

## 2017-02-16 LAB — BASIC METABOLIC PANEL
Anion gap: 8 (ref 5–15)
BUN: 19 mg/dL (ref 6–20)
CO2: 25 mmol/L (ref 22–32)
CREATININE: 0.94 mg/dL (ref 0.61–1.24)
Calcium: 9.1 mg/dL (ref 8.9–10.3)
Chloride: 104 mmol/L (ref 101–111)
GFR calc Af Amer: >60 mL/min (ref >60)
GFR calc non Af Amer: >60 mL/min (ref >60)
GLUCOSE: 92 mg/dL (ref 65–99)
Potassium: 3.9 mmol/L (ref 3.5–5.1)
SODIUM: 137 mmol/L (ref 135–145)

## 2017-02-16 LAB — CBC
HCT: 28.5 % — ABNORMAL LOW (ref 39.0–52.0)
Hemoglobin: 9.6 g/dL — ABNORMAL LOW (ref 13.0–17.0)
MCH: 28.7 pg (ref 26.0–34.0)
MCHC: 33.7 g/dL (ref 30.0–36.0)
MCV: 85.3 fL (ref 78.0–100.0)
PLATELETS: 152 10*3/uL (ref 150–400)
RBC: 3.34 MIL/uL — ABNORMAL LOW (ref 4.22–5.81)
RDW: 13.7 % (ref 11.5–15.5)
WBC: 8.6 10*3/uL (ref 4.0–10.5)

## 2017-02-16 LAB — MAGNESIUM: MAGNESIUM: 1.6 mg/dL — AB (ref 1.7–2.4)

## 2017-02-16 NOTE — Discharge Instructions (Signed)
You were admitted for head wound, and workup for possible stroke which was also negative.  You should follow up with Dr. Sullivan LoneGilbert in a week for follow up of your head wound and repeat blood work. Your sutures should be taken out 10 days after they were placed (02/13/17). Keep the area clean and dry until sutures are removed.  Your ECHO showed worsening aortic stenosis and should be followed by your outpatient Cardiologist, Dr. Lady GaryFath.   Follow up with Dr. Lady GaryFath on 11/30 at 1:30pm. Follow up with Dr. Sullivan LoneGilbert within a week.

## 2017-02-16 NOTE — Progress Notes (Signed)
Family Medicine Teaching Service Daily Progress Note Intern Pager: [phone]  Patient name: Charles Strickland Medical record number: [id number] Date of birth: 20-May-1920 Age: 83 y.o. Gender: male  Primary Care Provider: Maple Hudson., MD Consultants: None Code Status: Partial (Does not want Cardiac Resuscitation but would want intubation/respiratory support in case of an emergency)  Pt Overview and Major Events to Date:  11/17 - admitted for falls, unsteadiness  Assessment and Plan: Charles Strickland is a 83 y.o. male presenting with 2 falls x1d and resulting head laceration. PMH is significant for aortic stenosis s/p bovine valve replacement in 2012, carotid artery occlusion, HLD, HTN, and OA.   Falls: s/p forward fall yesterday morning after tripping over a curb with no LOC and head laceration repaired in the ED. It was initially thought that he fell from standing and began syncope/stroke workup. CT head in ED showed no acute intracranial abnormalities (did show atrophy with chronic small vessel ischemia and "chronic right-sided thalamic, caudate, and internal capsule lacunar infarcts"). CXR with mild cardiomegaly, minimal L effusion and L base atelectasis. With no new neurological deficits, and clarification on events surrounding fall, convincing for mechanical etiology. Carotid U/S done yesterday with preliminary read R 1-39% stenosis, 40-59% L ICA stenosis, unchanged from prior. Orthostatics negative. F/u ECHO with worsening aortic stenosis. With largely negative workup, can d/c home today. - Up with assistance - PT/OT consult - recommend HH - I/Os  Head Laceration: Deep to periosteum at one point. S/p lac repair and Tdap, Ancef 2g in ED.  Sutures in place with well approximated wound. Dressing applied. Hgb 10.9 > 9.8, ~12 at baseline. Drop is likely from blood loss 2/2 head laceration. Not symptomatic. CT c-spine showed cervical spondylosis without acute cervical spine fracture. -  tylenol prn  - ice prn  Aortic stenosis: Cardiologist is Dr. Lady Gary in [place]. Per medical record, "underwent aortic valve replacement for calcific aortic stenosis as well as coronary artery bypass grafting with a left internal mammary to the LAD and a saphenous vein graft to the right PDA" in 2012. ECHO performed yesterday which showed worsening AS. Will need to f/u with outpatient Cardiologist Dr. Lady Gary.  HTN: On amlodipine 5 mg, metoprolol tartrate 50 mg BID, losartan-hctz 100-25 mg daily, benazepril 40 mg daily. BP wnl. - Will hold losartan-hctz and benazepril in setting of AKI - continue amlodipine, metoprolol - restart benazepril on d/c  AKI: SCr 1.40 > 1.14; baseline ~1.00. Suspect inadequate water intake due to slightly dry MM, though BUN:Cr < 20.  - holding ARB  Dyspnea: Takes albuterol and dulera prn at home. Unsure of any diagnosis (COPD vs asthma). O2 saturation 100% on room air. CXR with mild cardiomegaly, minimal L effusion and L base atelectasis.  Hypokalemia: K 3.1 >> 3.9. Mg 1.4>1.8 - replete with kdur 40 as necessary - Monitor with BMP  FEN/GI: heart healthy diet Prophylaxis: lovenox  Disposition: d/c today  Subjective:  Patient feeling ok today, no complaints of pain. Ready to go home today.  Objective: Temp:  [97.8 F (36.6 C)-98.2 F (36.8 C)] 97.8 F (36.6 C) (11/20 0456) Pulse Rate:  [70-80] 73 (11/20 0456) Resp:  [16-18] 17 (11/20 0456) BP: (118-130)/(56-59) 130/57 (11/20 0456) SpO2:  [95 %-99 %] 95 % (11/20 0841) Weight:  [166 lb (75.3 kg)] 166 lb (75.3 kg) (11/20 0456)  Physical Exam: General: pleasant male sitting in bed, in NAD HEENT: sutures in place with well approximated wound. Dressing applied. Bruising noted over R and L eyes. Cardiovascular:  RRR, systolic murmur. No LE edema Respiratory: CTA bilaterally, no crackles/wheezing appreciated. Abdomen: soft, nontender to palpation, non distended. Neuro: Alert and oriented, speech normal.    Laboratory: Recent Labs  Lab 02/14/17 0241 02/15/17 0307 02/16/17 0354  WBC 12.7* 10.3 8.6  HGB 10.3* 9.8* 9.6*  HCT 29.9* 28.9* 28.5*  PLT 164 158 152   Recent Labs  Lab 02/13/17 1807  02/14/17 1429 02/15/17 0307 02/16/17 0354  NA 138   < > 138 136 137  K 3.1*   < > 3.3* 4.2 3.9  CL 105   < > 105 107 104  CO2 26   < > 26 24 25   BUN 18   < > 18 19 19   CREATININE 1.40*   < > 1.29* 1.14 0.94  CALCIUM 9.6   < > 9.2 9.1 9.1  PROT 6.5  --   --   --   --   BILITOT 0.6  --   --   --   --   ALKPHOS 79  --   --   --   --   ALT 19  --   --   --   --   AST 36  --   --   --   --   GLUCOSE 111*   < > 128* 91 92   < > = values in this interval not displayed.    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 Heart is mildly enlarged. Improving aeration at the left base with minimal residual left effusion and left base atelectasis. Right lung is clear. Aortic atherosclerosis. IMPRESSION: Improving aeration at the left base with decreasing left effusion and atelectasis. Electronically Signed   By: Charlett NoseKevin  Dover M.D.   On: 02/14/2017 08:53   Ct Head Wo Contrast  Result Date: 02/13/2017 CLINICAL DATA:  Laceration to head after fall today. EXAM: CT HEAD WITHOUT CONTRAST CT CERVICAL SPINE WITHOUT CONTRAST TECHNIQUE: Multidetector CT imaging of the head and cervical spine was performed following the standard protocol without intravenous contrast. Multiplanar CT image reconstructions of the cervical spine were also generated. COMPARISON:  None. FINDINGS: CT HEAD FINDINGS Brain: Sulcal and ventricular prominence consistent with atrophy. Chronic right-sided thalamic, caudate, and internal capsule lacunar infarcts with chronic appearing small vessel ischemic disease of periventricular and deep white matter. Vascular: No hyperdense vessel  or unexpected calcification. Skull: No skull fracture. Sinuses/Orbits: Bilateral lens replacements. Intact orbits and globes. No acute sinus disease. Other: Bilateral mastoid effusions with near complete opacification of the right mastoid and opacification of the left mastoid. Scalp hematoma and contusion overlying the left frontoparietal skull with soft tissue lacerations. CT CERVICAL SPINE FINDINGS Alignment: Intact craniocervical relationship. Osteoarthritis of the atlantodental interval with joint space narrowing. No acute fracture. Mild grade 1 anterolisthesis of C4 on C5 and C5 on C6. Skull base and vertebrae: No acute fracture. No primary bone lesion or focal pathologic process. Soft tissues and spinal canal: No prevertebral fluid or swelling. No visible canal hematoma. Disc levels: Cervical spondylosis with multilevel disc space narrowing. Vacuum disc phenomenon noted C5-6 and C6-7. Multilevel degenerative facet arthropathy with joint space narrowing, facet hypertrophy and sclerosis. Uncovertebral joint osteoarthritis is noted on the right at C3-4, bilaterally from C4 through T1. Upper chest: Scarring at the apices. Other: Nuchal ligament ossifications noted. Bilateral extracranial  carotid arteriosclerosis. IMPRESSION: 1. Soft tissue swelling of the scalp with associated lacerations and contusions overlying the left frontoparietal skull. No underlying skull fracture. 2. Atrophy with chronic small vessel ischemia. No acute intracranial abnormality. 3. Cervical spondylosis without acute cervical spine fracture. Electronically Signed   By: Tollie Ethavid  Kwon M.D.   On: 02/13/2017 19:14   Ct Cervical Spine Wo Contrast  Result Date: 02/13/2017 CLINICAL DATA:  Laceration to head after fall today. EXAM: CT HEAD WITHOUT CONTRAST CT CERVICAL SPINE WITHOUT CONTRAST TECHNIQUE: Multidetector CT imaging of the head and cervical spine was performed following the standard protocol without intravenous contrast. Multiplanar CT  image reconstructions of the cervical spine were also generated. COMPARISON:  None. FINDINGS: CT HEAD FINDINGS Brain: Sulcal and ventricular prominence consistent with atrophy. Chronic right-sided thalamic, caudate, and internal capsule lacunar infarcts with chronic appearing small vessel ischemic disease of periventricular and deep white matter. Vascular: No hyperdense vessel or unexpected calcification. Skull: No skull fracture. Sinuses/Orbits: Bilateral lens replacements. Intact orbits and globes. No acute sinus disease. Other: Bilateral mastoid effusions with near complete opacification of the right mastoid and opacification of the left mastoid. Scalp hematoma and contusion overlying the left frontoparietal skull with soft tissue lacerations. CT CERVICAL SPINE FINDINGS Alignment: Intact craniocervical relationship. Osteoarthritis of the atlantodental interval with joint space narrowing. No acute fracture. Mild grade 1 anterolisthesis of C4 on C5 and C5 on C6. Skull base and vertebrae: No acute fracture. No primary bone lesion or focal pathologic process. Soft tissues and spinal canal: No prevertebral fluid or swelling. No visible canal hematoma. Disc levels: Cervical spondylosis with multilevel disc space narrowing. Vacuum disc phenomenon noted C5-6 and C6-7. Multilevel degenerative facet arthropathy with joint space narrowing, facet hypertrophy and sclerosis. Uncovertebral joint osteoarthritis is noted on the right at C3-4, bilaterally from C4 through T1. Upper chest: Scarring at the apices. Other: Nuchal ligament ossifications noted. Bilateral extracranial carotid arteriosclerosis. IMPRESSION: 1. Soft tissue swelling of the scalp with associated lacerations and contusions overlying the left frontoparietal skull. No underlying skull fracture. 2. Atrophy with chronic small vessel ischemia. No acute intracranial abnormality. 3. Cervical spondylosis without acute cervical spine fracture. Electronically Signed    By: Tollie Ethavid  Kwon M.D.   On: 02/13/2017 19:14   Dg Hand Complete Left  Result Date: 02/15/2017 CLINICAL DATA:  83 year old male with fall and trauma to the left hand. EXAM: LEFT HAND - COMPLETE 3+ VIEW COMPARISON:  None. FINDINGS: There is no acute fracture or dislocation. The bones are osteopenic. The soft tissues appear unremarkable. IMPRESSION: 1. No acute fracture or dislocation. 2. Osteopenia. Electronically Signed   By: Elgie CollardArash  Radparvar M.D.   On: 02/15/2017 23:42   Ellwood DenseRumball, Eleni Frank, DO 02/16/2017, 9:31 AM PGY-1, [facility] FPTS Intern pager: [phone], text pages welcome

## 2017-02-16 NOTE — Progress Notes (Signed)
Charles Strickland to be D/C'd Home per MD order.  Discussed with the patient and all questions fully answered.  VSS, Skin clean, dry and intact without evidence of skin break down, no evidence of skin tears noted. IV catheter discontinued intact. Site without signs and symptoms of complications. Dressing and pressure applied.  An After Visit Summary was printed and given to the patient.  D/c education completed with patient/family including follow up instructions, medication list, d/c activities limitations if indicated, with other d/c instructions as indicated by MD - patient able to verbalize understanding, all questions fully answered.   Patient instructed to return to ED, call 911, or call MD for any changes in condition.   Patient escorted via NT, and D/C home via daughters private auto. Allergies as of 02/16/2017      Reactions   Sulfa Antibiotics Rash      Medication List    STOP taking these medications   loratadine 10 MG tablet Commonly known as:  CLARITIN   losartan-hydrochlorothiazide 100-25 MG tablet Commonly known as:  HYZAAR   metoprolol succinate 50 MG 24 hr tablet Commonly known as:  TOPROL-XL     TAKE these medications   amLODipine 5 MG tablet Commonly known as:  NORVASC Take 1 tablet (5 mg total) by mouth daily.   aspirin EC 81 MG tablet Take 81 mg daily by mouth.   benazepril 40 MG tablet Commonly known as:  LOTENSIN Take 40 mg daily by mouth.   cholecalciferol 1000 units tablet Commonly known as:  VITAMIN D Take 1,000 Units by mouth daily.   fluticasone 50 MCG/ACT nasal spray Commonly known as:  FLONASE Place 1 spray daily as needed into both nostrils for allergies or rhinitis.   LAXATIVE PO Take 1 tablet 2 (two) times daily by mouth.   metoprolol tartrate 50 MG tablet Commonly known as:  LOPRESSOR Take 50 mg 2 (two) times daily by mouth.   mometasone-formoterol 200-5 MCG/ACT Aero Commonly known as:  DULERA Inhale 2 puffs into the lungs 2  (two) times daily.   multivitamin with minerals Tabs tablet Take 1 tablet See admin instructions by mouth. Take the contents of [employer] Whole Body Vitapak by mouth every morning   pravastatin 20 MG tablet Commonly known as:  PRAVACHOL Take 1 tablet (20 mg total) by mouth daily. What changed:  when to take this   tamsulosin 0.4 MG Caps capsule Commonly known as:  FLOMAX Take 2 tablets daily What changed:    how much to take  how to take this  when to take this  additional instructions   VENTOLIN HFA 108 (90 Base) MCG/ACT inhaler Generic drug:  albuterol Inhale 2 puffs 3 (three) times daily as needed into the lungs for wheezing or shortness of breath.   vitamin B-12 500 MCG tablet Commonly known as:  CYANOCOBALAMIN Take 500 mcg by mouth daily.      Casper HarrisonSamantha K Monick Rena 02/16/2017 3:03 PM

## 2017-02-16 NOTE — Progress Notes (Signed)
Physical Therapy Treatment Patient Details Name: Collene Maresheodore Prestia MRN: [id number] DOB: [birth year] Today's Date: 02/16/2017    History of Present Illness 83 y.o. male presenting after fall with laceration to head. head CT negative. TEE 11/19 shows severe stenosis of aortic valve. PMH significant of aortic stenosis s/p bovine valve replacement, CAD, HLD, HTN, OA, and hearing loss.     PT Comments    Pt progresses towards PT goals today. Pt's demonstrates steadier gait during ambulation today using single point cane and min guard as compared to yesterday using RW. Pt uses cane at home, and family states that is what he is used to. Pt demonstrates stair negotiation with min guard for safety. Pt's BP responded well to exercise and orthostatic testing. Pt still appropriate for home health PT following discharge from hospital and will benefit from skilled PT acutely in order to improve functional mobility.    Follow Up Recommendations  Home health PT;Supervision/Assistance - 24 hour     Equipment Recommendations  None recommended by PT    Recommendations for Other Services       Precautions / Restrictions Precautions Precautions: Fall Restrictions Weight Bearing Restrictions: No    Mobility  Bed Mobility Overal bed mobility: Needs Assistance       Supine to sit: Modified independent (Device/Increase time);HOB elevated     General bed mobility comments: Pt requiring increased time for bed mobility.   Transfers Overall transfer level: Needs assistance   Transfers: Sit to/from Stand Sit to Stand: Min assist         General transfer comment: sit-to-stand x1. Pt requires increased time for sit-to-stand.   Ambulation/Gait Ambulation/Gait assistance: Min guard Ambulation Distance (Feet): 200 Feet Assistive device: Straight cane Gait Pattern/deviations: Step-through pattern;Trunk flexed;Decreased stride length Gait velocity: decreased   General Gait Details: Pt ambulates  close to walls/rails, assumed for safety with mobility.    Stairs Stairs: Yes   Stair Management: One rail Right;Alternating pattern;With cane Number of Stairs: 5 General stair comments: Pt does not appear to have much difficulty with stairs and navigates slow and steady. Min guard for safety.   Wheelchair Mobility    Modified Rankin (Stroke Patients Only)       Balance Overall balance assessment: Needs assistance Sitting-balance support: No upper extremity supported;Feet supported Sitting balance-Leahy Scale: Fair Sitting balance - Comments: Pt able to sit EOB with UEs in lap.   Standing balance support: Single extremity supported;During functional activity Standing balance-Leahy Scale: Poor Standing balance comment: Pt reaches for object with RUE for support after standing fom EOB. Pt holds cane for external support.                             Cognition Arousal/Alertness: Awake/alert Behavior During Therapy: WFL for tasks assessed/performed Overall Cognitive Status: Within Functional Limits for tasks assessed                                        Exercises      General Comments General comments (skin integrity, edema, etc.): BP supine: 126/66, BP seated EOB: 122/83, BP standing: 114/59, BP seated after exercise: 137/67      Pertinent Vitals/Pain Pain Assessment: No/denies pain    Home Living                      Prior Function  PT Goals (current goals can now be found in the care plan section) Acute Rehab PT Goals Patient Stated Goal: to return home PT Goal Formulation: With patient Time For Goal Achievement: 02/28/17 Potential to Achieve Goals: Good Progress towards PT goals: Progressing toward goals    Frequency    Min 3X/week      PT Plan Current plan remains appropriate    Co-evaluation              AM-PAC PT "6 Clicks" Daily Activity  Outcome Measure  Difficulty turning over in bed  (including adjusting bedclothes, sheets and blankets)?: A Little Difficulty moving from lying on back to sitting on the side of the bed? : A Little Difficulty sitting down on and standing up from a chair with arms (e.g., wheelchair, bedside commode, etc,.)?: A Little Help needed moving to and from a bed to chair (including a wheelchair)?: A Little Help needed walking in hospital room?: A Little Help needed climbing 3-5 steps with a railing? : A Little 6 Click Score: 18    End of Session Equipment Utilized During Treatment: Gait belt Activity Tolerance: Patient tolerated treatment well Patient left: with call bell/phone within reach;in chair;with chair alarm set;with family/visitor present Nurse Communication: Mobility status PT Visit Diagnosis: Unsteadiness on feet (R26.81);Muscle weakness (generalized) (M62.81);Difficulty in walking, not elsewhere classified (R26.2)     Time: [id number] PT Time Calculation (min) (ACUTE ONLY): 26 min  Charges:  $Gait Training: 8-22 mins $Therapeutic Activity: 8-22 mins                    G Codes:       Reina Fuserin Johnelle Tafolla, SPT   Reina Fuserin Aeron Lheureux 02/16/2017, 1:52 PM

## 2017-02-17 ENCOUNTER — Telehealth: Payer: Self-pay | Admitting: Family Medicine

## 2017-02-17 NOTE — Telephone Encounter (Signed)
Please review-Anastasiya V Hopkins, RMA  

## 2017-02-17 NOTE — Telephone Encounter (Signed)
Charles DavenportSandra pt Caregiver called stating pt fell on Saturday 02/13/17 and has [facility] [place] yesterday.  When pt fell he hit is head and now has about 20 stitches that need to be removed.  Pt is requesting an appointment 11/27, 11/28 or 02/25/17. Please advised.  CB#[phone]/Beth/MW

## 2017-02-22 ENCOUNTER — Telehealth: Payer: Self-pay | Admitting: Family Medicine

## 2017-02-22 NOTE — Telephone Encounter (Signed)
Please review-Ameirah Khatoon V Hayes Rehfeldt, RMA  

## 2017-02-22 NOTE — Telephone Encounter (Signed)
Stacy with [facility] is requesting a verbal order for physical therapy, 2 times a week for 3 weeks and 1 time a week for 1 week.  XL#[phone]/DGCB#[phone]/MW

## 2017-02-23 ENCOUNTER — Encounter: Payer: Self-pay | Admitting: Family Medicine

## 2017-02-23 NOTE — Telephone Encounter (Signed)
Kennyth ArnoldStacy called back saying she cannot go back out to see patient until she has a verbal approval.  Can she get an ok so she can see pt.  Her number is [phone]  Thanks Barth Kirksteri

## 2017-02-24 ENCOUNTER — Ambulatory Visit: Payer: Medicare Other | Admitting: Family Medicine

## 2017-02-24 VITALS — BP 124/62 | HR 76 | Temp 97.6°F | Resp 16 | Wt 162.0 lb

## 2017-02-24 DIAGNOSIS — S0101XD Laceration without foreign body of scalp, subsequent encounter: Secondary | ICD-10-CM

## 2017-02-24 NOTE — Telephone Encounter (Signed)
Stacy Avon Gullyadvised-Nikai Quest V Joneric Streight, RMA

## 2017-02-24 NOTE — Telephone Encounter (Signed)
ok 

## 2017-02-24 NOTE — Progress Notes (Signed)
[name]    DOB: [birth year]   83 y.o.   MRN: [id number] Visit Date: 02/24/2017  Today's Provider: Shirlee LatchAngela Huan Pollok, MD   Chief Complaint  Patient presents with  . Suture / Staple Removal   Subjective:    Suture / Staple Removal  Treated in ED: [facility] ER via EMS on 02/13/2017 due a fall in [employer] parking lot resulting in a frontal and left parietal scalp laceration. 11 suture was used to close 10 cm laceration.    No bleeding since suturing.  States that he is staying at home more to avoid further falls.    Allergies  Allergen Reactions  . Sulfa Antibiotics Rash     Current Outpatient Medications:  .  albuterol (VENTOLIN HFA) 108 (90 BASE) MCG/ACT inhaler, Inhale 2 puffs 3 (three) times daily as needed into the lungs for wheezing or shortness of breath. , Disp: , Rfl:  .  amLODipine (NORVASC) 5 MG tablet, Take 1 tablet (5 mg total) by mouth daily., Disp: 30 tablet, Rfl: 0 .  aspirin EC 81 MG tablet, Take 81 mg daily by mouth., Disp: , Rfl:  .  benazepril (LOTENSIN) 40 MG tablet, Take 40 mg daily by mouth. , Disp: , Rfl:  .  Bisacodyl (LAXATIVE PO), Take 1 tablet 2 (two) times daily by mouth., Disp: , Rfl:  .  cholecalciferol (VITAMIN D) 1000 units tablet, Take 1,000 Units by mouth daily., Disp: , Rfl:  .  fluticasone (FLONASE) 50 MCG/ACT nasal spray, Place 1 spray daily as needed into both nostrils for allergies or rhinitis., Disp: , Rfl:  .  metoprolol tartrate (LOPRESSOR) 50 MG tablet, Take 50 mg 2 (two) times daily by mouth., Disp: , Rfl:  .  mometasone-formoterol (DULERA) 200-5 MCG/ACT AERO, Inhale 2 puffs into the lungs 2 (two) times daily., Disp: 1 Inhaler, Rfl: 12 .  Multiple Vitamin (MULTIVITAMIN WITH MINERALS) TABS tablet, Take 1 tablet See admin instructions by mouth. Take the contents of [employer] Whole Body Vitapak by mouth every morning, Disp: , Rfl:  .  pravastatin (PRAVACHOL) 20 MG tablet, Take 1 tablet (20 mg total) by mouth daily. (Patient  taking differently: Take 20 mg at bedtime by mouth. ), Disp: 30 tablet, Rfl: 0 .  tamsulosin (FLOMAX) 0.4 MG CAPS capsule, Take 2 tablets daily (Patient taking differently: Take 0.8 mg daily by mouth. Take 2 tablets daily), Disp: 180 capsule, Rfl: 3 .  vitamin B-12 (CYANOCOBALAMIN) 500 MCG tablet, Take 500 mcg by mouth daily., Disp: , Rfl:   Review of Systems  Constitutional: Negative.   HENT: Negative.   Respiratory: Negative.   Cardiovascular: Negative.   Skin: Negative.   Neurological: Negative.   Psychiatric/Behavioral: Negative.     Social History   Tobacco Use  . Smoking status: Former Smoker    Packs/day: 3.00    Types: Cigarettes  . Smokeless tobacco: Former NeurosurgeonUser    Types: Snuff, Chew  . Tobacco comment: quit 40 years ago  Substance Use Topics  . Alcohol use: No   Objective:   BP 124/62 (BP Location: Left Arm, Patient Position: Sitting, Cuff Size: Normal)   Pulse 76   Temp 97.6 F (36.4 C) (Oral)   Resp 16   Wt 162 lb (73.5 kg)   BMI 25.00 kg/m    Physical Exam  Constitutional: He appears well-developed and well-nourished. No distress.  HENT:  Suture line noted on L side of scalp with scabbing. Dressing removed  Neurological: He  is alert.  Skin: Skin is warm and dry. No rash noted. No erythema.  Vitals reviewed.       Assessment & Plan:     1. Laceration of scalp, subsequent encounter - suture removed without complication - no bleeding after removal - no signs of infection of wound - dressed before leaving - avoid scrubbing area, but ok to wash gently      The entirety of the information documented in the History of Present Illness, Review of Systems and Physical Exam were personally obtained by me. Portions of this information were initially documented by Gretta ArabNicki Walston, CMA and reviewed by me for thoroughness and accuracy.     Charles LatchAngela Yulitza Shorts, MD  [facility]

## 2017-02-24 NOTE — Patient Instructions (Signed)

## 2017-04-02 DIAGNOSIS — I69354 Hemiplegia and hemiparesis following cerebral infarction affecting left non-dominant side: Secondary | ICD-10-CM | POA: Diagnosis not present

## 2017-04-02 DIAGNOSIS — I350 Nonrheumatic aortic (valve) stenosis: Secondary | ICD-10-CM | POA: Diagnosis not present

## 2017-04-02 DIAGNOSIS — I10 Essential (primary) hypertension: Secondary | ICD-10-CM | POA: Diagnosis not present

## 2017-04-02 DIAGNOSIS — S0101XD Laceration without foreign body of scalp, subsequent encounter: Secondary | ICD-10-CM | POA: Diagnosis not present

## 2017-04-05 ENCOUNTER — Encounter: Payer: Medicare Other | Admitting: Family Medicine

## 2017-05-28 ENCOUNTER — Other Ambulatory Visit: Payer: Self-pay | Admitting: Cardiology

## 2017-05-28 ENCOUNTER — Ambulatory Visit
Admission: RE | Admit: 2017-05-28 | Discharge: 2017-05-28 | Disposition: A | Discharge status: Home / Self Care | Payer: Medicare Other | Source: Ambulatory Visit | Attending: Cardiology | Admitting: Cardiology

## 2017-05-28 DIAGNOSIS — R600 Localized edema: Secondary | ICD-10-CM | POA: Diagnosis present

## 2017-06-11 ENCOUNTER — Ambulatory Visit
Admission: RE | Admit: 2017-06-11 | Discharge: 2017-06-11 | Disposition: A | Discharge status: Home / Self Care | Payer: Medicare Other | Source: Ambulatory Visit | Attending: Family Medicine | Admitting: Family Medicine

## 2017-06-11 ENCOUNTER — Encounter: Payer: Self-pay | Admitting: Family Medicine

## 2017-06-11 ENCOUNTER — Telehealth: Payer: Self-pay

## 2017-06-11 ENCOUNTER — Ambulatory Visit: Payer: Medicare Other | Admitting: Family Medicine

## 2017-06-11 VITALS — BP 124/72 | HR 76 | Temp 98.3°F | Resp 16

## 2017-06-11 DIAGNOSIS — M25552 Pain in left hip: Secondary | ICD-10-CM | POA: Insufficient documentation

## 2017-06-11 DIAGNOSIS — M1612 Unilateral primary osteoarthritis, left hip: Secondary | ICD-10-CM | POA: Diagnosis not present

## 2017-06-11 DIAGNOSIS — R296 Repeated falls: Secondary | ICD-10-CM | POA: Diagnosis not present

## 2017-06-11 NOTE — Telephone Encounter (Signed)
-----   Message from Erasmo DownerAngela M Bacigalupo, MD sent at 06/11/2017  4:54 PM EDT ----- No fractures!  Erasmo DownerBacigalupo, Angela M, MD, MPH [facility] 06/11/2017 4:54 PM

## 2017-06-11 NOTE — Telephone Encounter (Signed)
Encounter opened in error. KW 

## 2017-06-11 NOTE — Telephone Encounter (Signed)
Pt's daughter advised.  ? ?Thanks,  ? ?-Lailie Smead  ?

## 2017-06-11 NOTE — Patient Instructions (Signed)

## 2017-06-11 NOTE — Progress Notes (Signed)
[name]    DOB: [birth year]   84 y.o.   MRN: [id number] Visit Date: 06/11/2017  Today's Provider: Shirlee Latch, MD   Chief Complaint  Patient presents with  . Fall    Pt reports falling on Wednesday   Subjective:    Fall  The accident occurred 3 to 5 days ago. Fall occurred: Pt reports he was trying to sit down on the stool and missed.  He reports hitting his head on his humidifier and fell on his bottom.  There was no blood loss. The point of impact was the buttocks and head. Pertinent negatives include no abdominal pain, headaches, nausea or vomiting.   Patient fell onto his butt 2 days ago when trying to sit on a low stool and falling instead. He then hit his head on a humidifier.  He denies any prodrome of dizziness/chest pain/ SOB/ palpitations. He has h/o frequent falls.  He uses a walker at home to get around. House has grab bars and other safety features.  All throw rugs and tripping hazards have been removed.  He has had L lateral/posterior hip pain.  He has been able to walk and even go up and down stairs since that time.    Allergies  Allergen Reactions  . Sulfa Antibiotics Rash     Current Outpatient Medications:  .  albuterol (VENTOLIN HFA) 108 (90 BASE) MCG/ACT inhaler, Inhale 2 puffs 3 (three) times daily as needed into the lungs for wheezing or shortness of breath. , Disp: , Rfl:  .  amLODipine (NORVASC) 5 MG tablet, Take 1 tablet (5 mg total) by mouth daily., Disp: 30 tablet, Rfl: 0 .  aspirin EC 81 MG tablet, Take 81 mg daily by mouth., Disp: , Rfl:  .  benazepril (LOTENSIN) 40 MG tablet, Take 40 mg daily by mouth. , Disp: , Rfl:  .  Bisacodyl (LAXATIVE PO), Take 1 tablet 2 (two) times daily by mouth., Disp: , Rfl:  .  cholecalciferol (VITAMIN D) 1000 units tablet, Take 1,000 Units by mouth daily., Disp: , Rfl:  .  fluticasone (FLONASE) 50 MCG/ACT nasal spray, Place 1 spray daily as needed into both nostrils for allergies or  rhinitis., Disp: , Rfl:  .  metoprolol tartrate (LOPRESSOR) 50 MG tablet, Take 50 mg 2 (two) times daily by mouth., Disp: , Rfl:  .  mometasone-formoterol (DULERA) 200-5 MCG/ACT AERO, Inhale 2 puffs into the lungs 2 (two) times daily., Disp: 1 Inhaler, Rfl: 12 .  Multiple Vitamin (MULTIVITAMIN WITH MINERALS) TABS tablet, Take 1 tablet See admin instructions by mouth. Take the contents of [employer] Vitapak by mouth every morning, Disp: , Rfl:  .  pravastatin (PRAVACHOL) 20 MG tablet, Take 1 tablet (20 mg total) by mouth daily. (Patient taking differently: Take 20 mg at bedtime by mouth. ), Disp: 30 tablet, Rfl: 0 .  tamsulosin (FLOMAX) 0.4 MG CAPS capsule, Take 2 tablets daily (Patient taking differently: Take 0.8 mg daily by mouth. Take 2 tablets daily), Disp: 180 capsule, Rfl: 3 .  vitamin B-12 (CYANOCOBALAMIN) 500 MCG tablet, Take 500 mcg by mouth daily., Disp: , Rfl:   Review of Systems  Constitutional: Negative.   Cardiovascular: Positive for leg swelling. Negative for chest pain and palpitations.  Gastrointestinal: Positive for constipation. Negative for abdominal distention, abdominal pain, anal bleeding, blood in stool, diarrhea, nausea, rectal pain and vomiting.  Musculoskeletal: Positive for arthralgias (Left hip pain) and myalgias (Left leg pain). Negative  for back pain, gait problem, joint swelling, neck pain and neck stiffness.  Neurological: Negative for dizziness, light-headedness and headaches.    Social History   Tobacco Use  . Smoking status: Former Smoker    Packs/day: 3.00    Types: Cigarettes  . Smokeless tobacco: Former NeurosurgeonUser    Types: Snuff, Chew  . Tobacco comment: quit 40 years ago  Substance Use Topics  . Alcohol use: No   Objective:   BP 124/72 (BP Location: Left Arm, Patient Position: Sitting, Cuff Size: Normal)   Pulse 76   Temp 98.3 F (36.8 C) (Oral)   Resp 16  Vitals:   06/11/17 1356  BP: 124/72  Pulse: 76  Resp: 16  Temp: 98.3 F (36.8 C)    TempSrc: Oral     Physical Exam  Constitutional: He is oriented to person, place, and time. He appears well-developed and well-nourished. No distress.  HENT:  Head: Normocephalic and atraumatic.  Eyes: Conjunctivae are normal. No scleral icterus.  Cardiovascular: Normal rate and regular rhythm.  Pulmonary/Chest: Effort normal and breath sounds normal. No respiratory distress. He has no wheezes. He has no rales.  Musculoskeletal: He exhibits edema.  L hip TTP over greater trochanter and ischium and soft tissue surrounding. ROM is comparable to R side.  Pain with internal rotation. Strength intact with flexion, abduction.  Neurological: He is alert and oriented to person, place, and time.  Skin: Skin is warm and dry.  Psychiatric: He has a normal mood and affect. His behavior is normal.  Vitals reviewed.       Assessment & Plan:     1. Falls frequently - discussed importance of preventing falls - sounds as though patient's home is a safe environment -Offered physical therapy referral, but patient declines as he feels though he has done all that he can with physical therapy - Discussed importance of using his walker whenever he is moving around in the house or outside - No prodrome to suspect anything other than mechanical fall as patient describes, no loss of consciousness - DG HIP UNILAT WITH PELVIS 1V LEFT; Future  2. Left hip pain -With tenderness to palpation over bony landmarks, as well as pain with internal rotation, some concern for possible hip fracture -It is reassuring however, that he has been able to walk around without much discomfort for the last few days -We will obtain x-ray to ensure no fracture -Management pending x-ray findings, but did recommend elevation, ice, Tylenol as needed for now - DG HIP UNILAT WITH PELVIS 1V LEFT; Future   Return if symptoms worsen or fail to improve.   The entirety of the information documented in the History of Present Illness,  Review of Systems and Physical Exam were personally obtained by me. Portions of this information were initially documented by Kavin LeechLaura Walsh, CMA and reviewed by me for thoroughness and accuracy.    Erasmo DownerBacigalupo, Angela M, MD, MPH [facility] 06/11/2017 2:44 PM

## 2017-06-25 ENCOUNTER — Telehealth: Payer: Self-pay

## 2017-06-25 NOTE — Telephone Encounter (Signed)
LMTCB and schedule AWV and cpe. Pt is currently scheduled for a f/u on 07/22/17, however pt is due for his physical and wellness this month (April). AWV and CPE need to be scheduled after 07/16/17. -MM

## 2017-07-01 NOTE — Telephone Encounter (Signed)
Scheduled pt for an AWV and CPE on 08/19/17 @ 1:20 and 2:00 PM.  -MM

## 2017-07-06 ENCOUNTER — Encounter: Payer: Self-pay | Admitting: Family Medicine

## 2017-07-08 NOTE — Telephone Encounter (Signed)
Maybe see us next week? May need assisted living?

## 2017-07-22 ENCOUNTER — Ambulatory Visit: Payer: Self-pay | Admitting: Family Medicine

## 2017-07-26 ENCOUNTER — Telehealth: Payer: Self-pay | Admitting: Family Medicine

## 2017-07-26 DIAGNOSIS — R627 Adult failure to thrive: Secondary | ICD-10-CM

## 2017-07-26 NOTE — Telephone Encounter (Signed)
Pt's daughter called saying she thinks maybe her dad is in last stages of life.  She said in the last month or two especially last couple of weeks.  Eating has declined.  Sleeping a lot more.  He does live by himself but they have been staying with him for the past couple weeks.  She does not think she can get get in the car.    Please advise to what they need to do  Her call back is [phone]    Thanks teri

## 2017-07-26 NOTE — Telephone Encounter (Signed)
Hospice referral

## 2017-07-26 NOTE — Telephone Encounter (Signed)
Spoke with Dois Davenport. She does not think pt is eligable for home health. She also does not think she can get pt in the car or out the door to come in for an office visit to asset the pt. She is agreeable to a hospice referral if you think this is the best option for pt at this point. She reports that pt has some good days and bad days.

## 2017-07-27 NOTE — Telephone Encounter (Signed)
Pt daughter advised. Referral placed.

## 2017-07-29 ENCOUNTER — Telehealth: Payer: Self-pay | Admitting: Family Medicine

## 2017-07-29 NOTE — Telephone Encounter (Signed)
Orders signed by Dr. Sullivan Lone this AM. Faxed by Eleonore Chiquito.

## 2017-07-29 NOTE — Telephone Encounter (Signed)
Daughter has called back saying she needs assistance as soon as possible.  She is not able to care for him as he should be.  She has not heard anything from Hospice and was hoping they would have contacted her by now.  teri

## 2017-07-30 ENCOUNTER — Other Ambulatory Visit: Payer: Self-pay | Admitting: Family Medicine

## 2017-07-30 ENCOUNTER — Other Ambulatory Visit: Payer: Self-pay

## 2017-07-30 MED ORDER — LORAZEPAM 1 MG PO TABS: 0.5000 mg | ORAL_TABLET | Freq: Four times a day (QID) | ORAL | 1 refills | Status: AC | PRN

## 2017-07-30 MED ORDER — LORAZEPAM 0.5 MG PO TABS: 0.5000 mg | ORAL_TABLET | ORAL | 1 refills | Status: DC

## 2017-07-30 MED ORDER — MORPHINE SULFATE (CONCENTRATE) 20 MG/ML PO SOLN: 20.0000 mg | ORAL | 0 refills | Status: AC

## 2017-07-30 NOTE — Progress Notes (Signed)
Change from 0.5 to  due to pharmacy only allowing 4 tablets per day.

## 2017-08-02 ENCOUNTER — Telehealth: Payer: Self-pay | Admitting: Family Medicine

## 2017-08-19 ENCOUNTER — Encounter: Payer: Medicare Other | Admitting: Family Medicine

## 2017-08-19 ENCOUNTER — Ambulatory Visit: Payer: Medicare Other

## 2017-08-28 NOTE — Telephone Encounter (Signed)
FYTI  [name] passed away at 10:15 am this morning.  teri

## 2017-08-28 DEATH — deceased

## 2018-11-24 IMAGING — CT CT CERVICAL SPINE W/O CM
5 of 8 series · 13 of 33 positions shown, 14 images · non-contrast
Comparison: None.

CLINICAL DATA: Laceration to head after fall today.

EXAM:
CT HEAD WITHOUT CONTRAST
CT CERVICAL SPINE WITHOUT CONTRAST
TECHNIQUE: Multidetector CT imaging of the head and cervical spine was
performed following the standard protocol without intravenous
contrast. Multiplanar CT image reconstructions of the cervical spine
were also generated.

[Series 4: head bone · axial · 0.44mm/px · z∈[-77,-21]mm · 2 of 85 slices shown]
[im 29/85  bone]
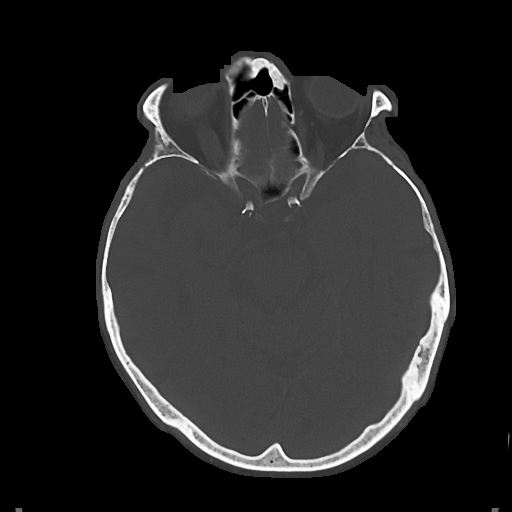
[im 57/85  bone]
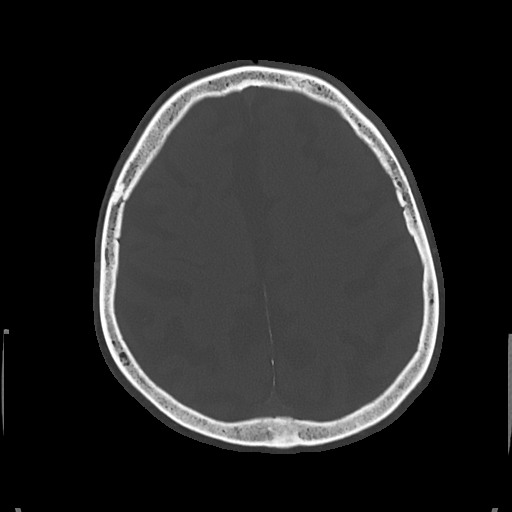

[Series 5: cor soft · coronal · 0.32mm/px · 3 of 71 slices shown]
[im 18/71  bone]
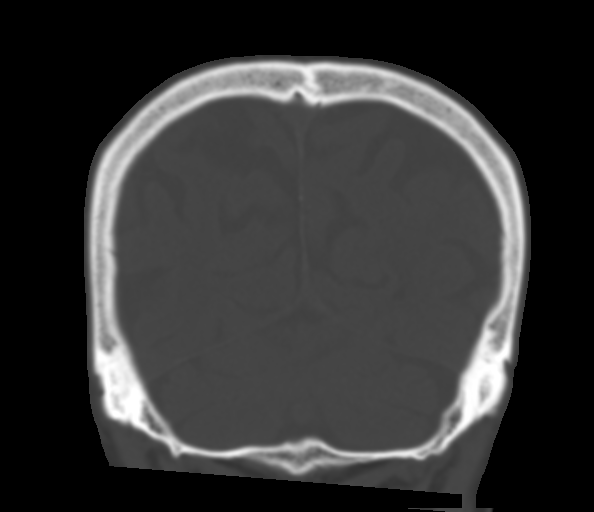
[im 36/71  bone]
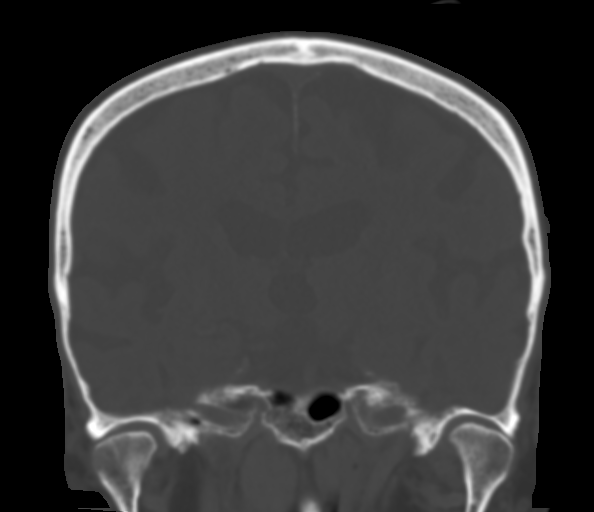
[im 53/71  bone]
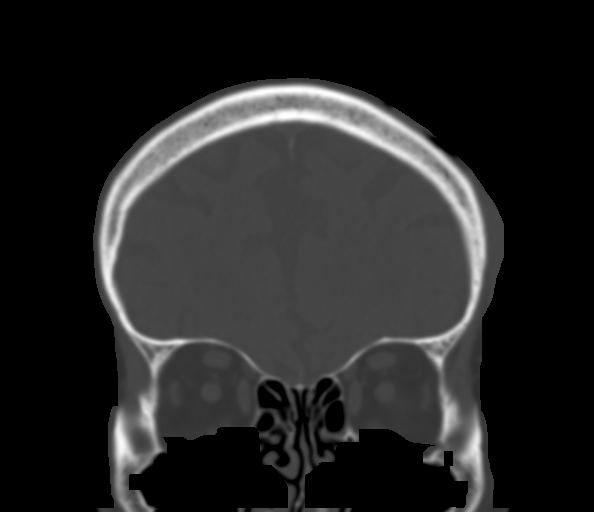

[Series 9: c spine soft · axial · 0.27mm/px · z∈[-192,-138]mm · 2 of 82 slices shown]
[im 28/82  soft-tissue]
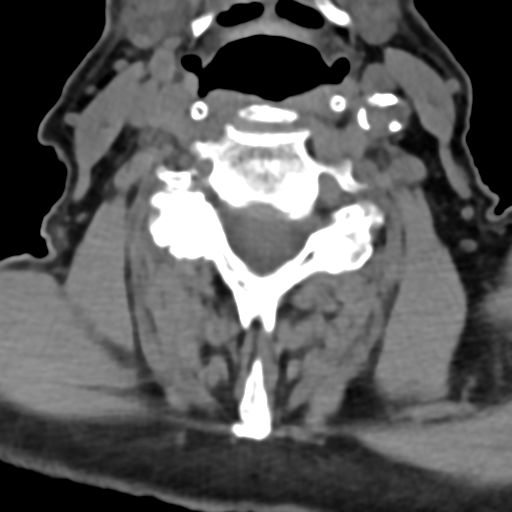
[im 55/82  soft-tissue]
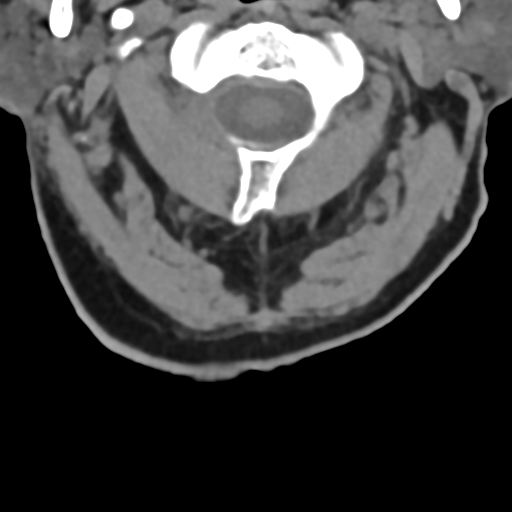

[Series 10: sag bone · sagittal · 0.31mm/px · 4 of 61 slices shown]
[im 13/61  bone]
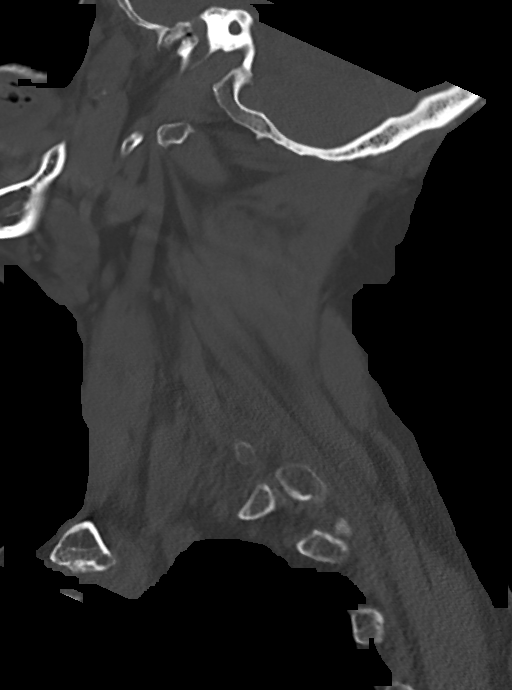
[im 25/61  bone]
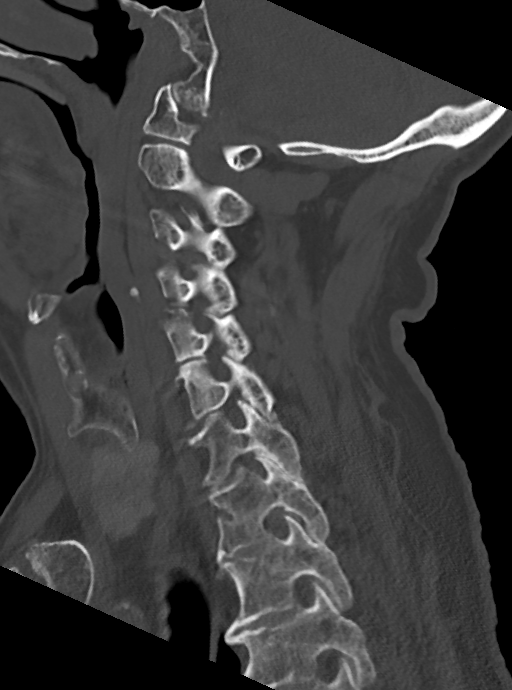
[im 37/61  bone]
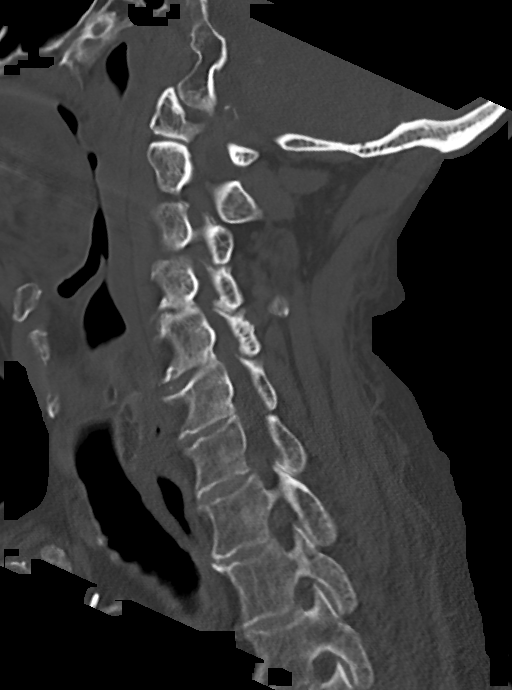
[im 49/61  bone]
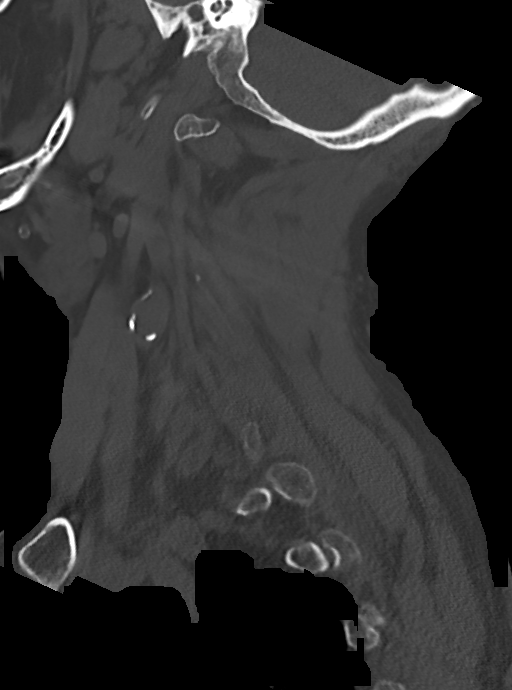

[Series 12: orthogonal axials · axial · 0.21mm/px · z∈[-233,-186]mm · 2 of 90 slices shown, 3 images]
[im 30/90  soft-tissue]
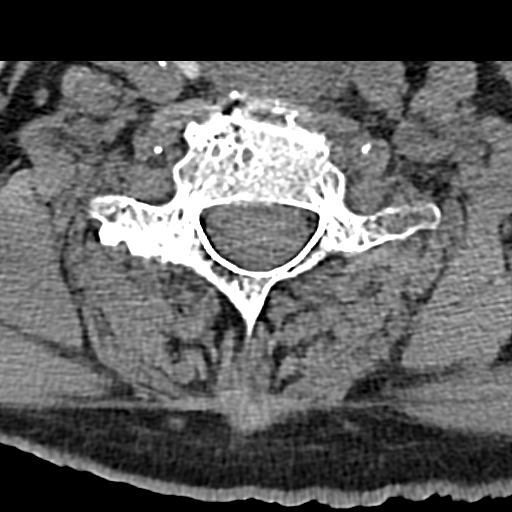
[im 30/90  bone]
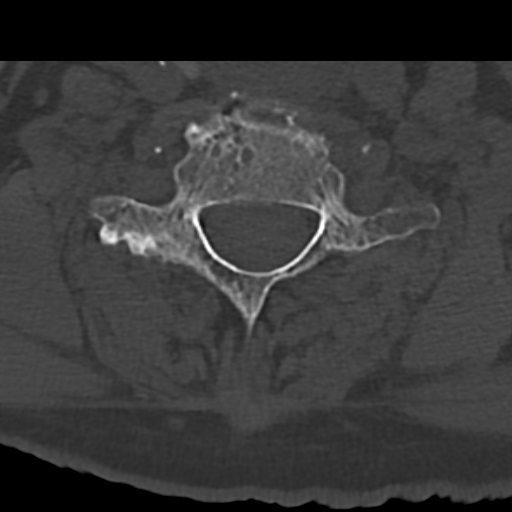
[im 60/90  bone]
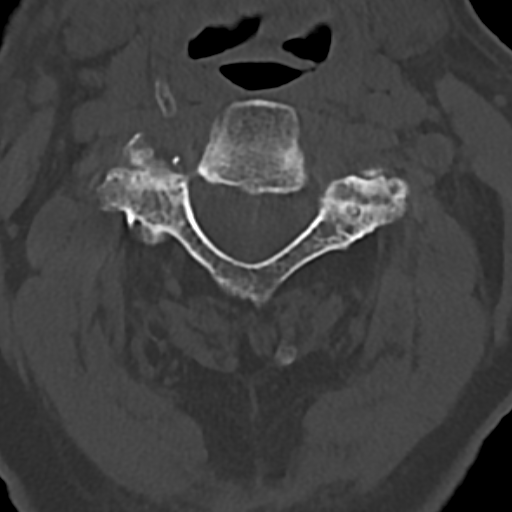

[13 of 33 positions shown; findings below may reference images not displayed]

FINDINGS: CT HEAD FINDINGS

Brain: Sulcal and ventricular prominence consistent with atrophy.
Chronic right-sided thalamic, caudate, and internal capsule lacunar
infarcts with chronic appearing small vessel ischemic disease of
periventricular and deep white matter.

Vascular: No hyperdense vessel or unexpected calcification.

Skull: No skull fracture.

Sinuses/Orbits: Bilateral lens replacements. Intact orbits and
globes. No acute sinus disease.

Other: Bilateral mastoid effusions with near complete opacification
of the right mastoid and opacification of the left mastoid. Scalp
hematoma and contusion overlying the left frontoparietal skull with
soft tissue lacerations.

CT CERVICAL SPINE FINDINGS

Alignment: Intact craniocervical relationship. Osteoarthritis of the
atlantodental interval with joint space narrowing. No acute
fracture. Mild grade 1 anterolisthesis of C4 on C5 and C5 on C6.

Skull base and vertebrae: No acute fracture. No primary bone lesion
or focal pathologic process.

Soft tissues and spinal canal: No prevertebral fluid or swelling. No
visible canal hematoma.

Disc levels: Cervical spondylosis with multilevel disc space
narrowing. Vacuum disc phenomenon noted C5-6 and C6-7. Multilevel
degenerative facet arthropathy with joint space narrowing, facet
hypertrophy and sclerosis. Uncovertebral joint osteoarthritis is
noted on the right at C3-4, bilaterally from C4 through T1.

Upper chest: Scarring at the apices.

Other: Nuchal ligament ossifications noted. Bilateral extracranial
carotid arteriosclerosis.
IMPRESSION: 1. Soft tissue swelling of the scalp with associated lacerations and
contusions overlying the left frontoparietal skull. No underlying
skull fracture.
2. Atrophy with chronic small vessel ischemia. No acute intracranial
abnormality.
3. Cervical spondylosis without acute cervical spine fracture.

## 2018-11-25 IMAGING — DX DG CHEST 2V
2 series · 2 of 2 positions shown · non-contrast
Comparison: 02/17/2011

CLINICAL DATA: Shortness of breath, chest pain

EXAM:
CHEST  2 VIEW

[chest lat]
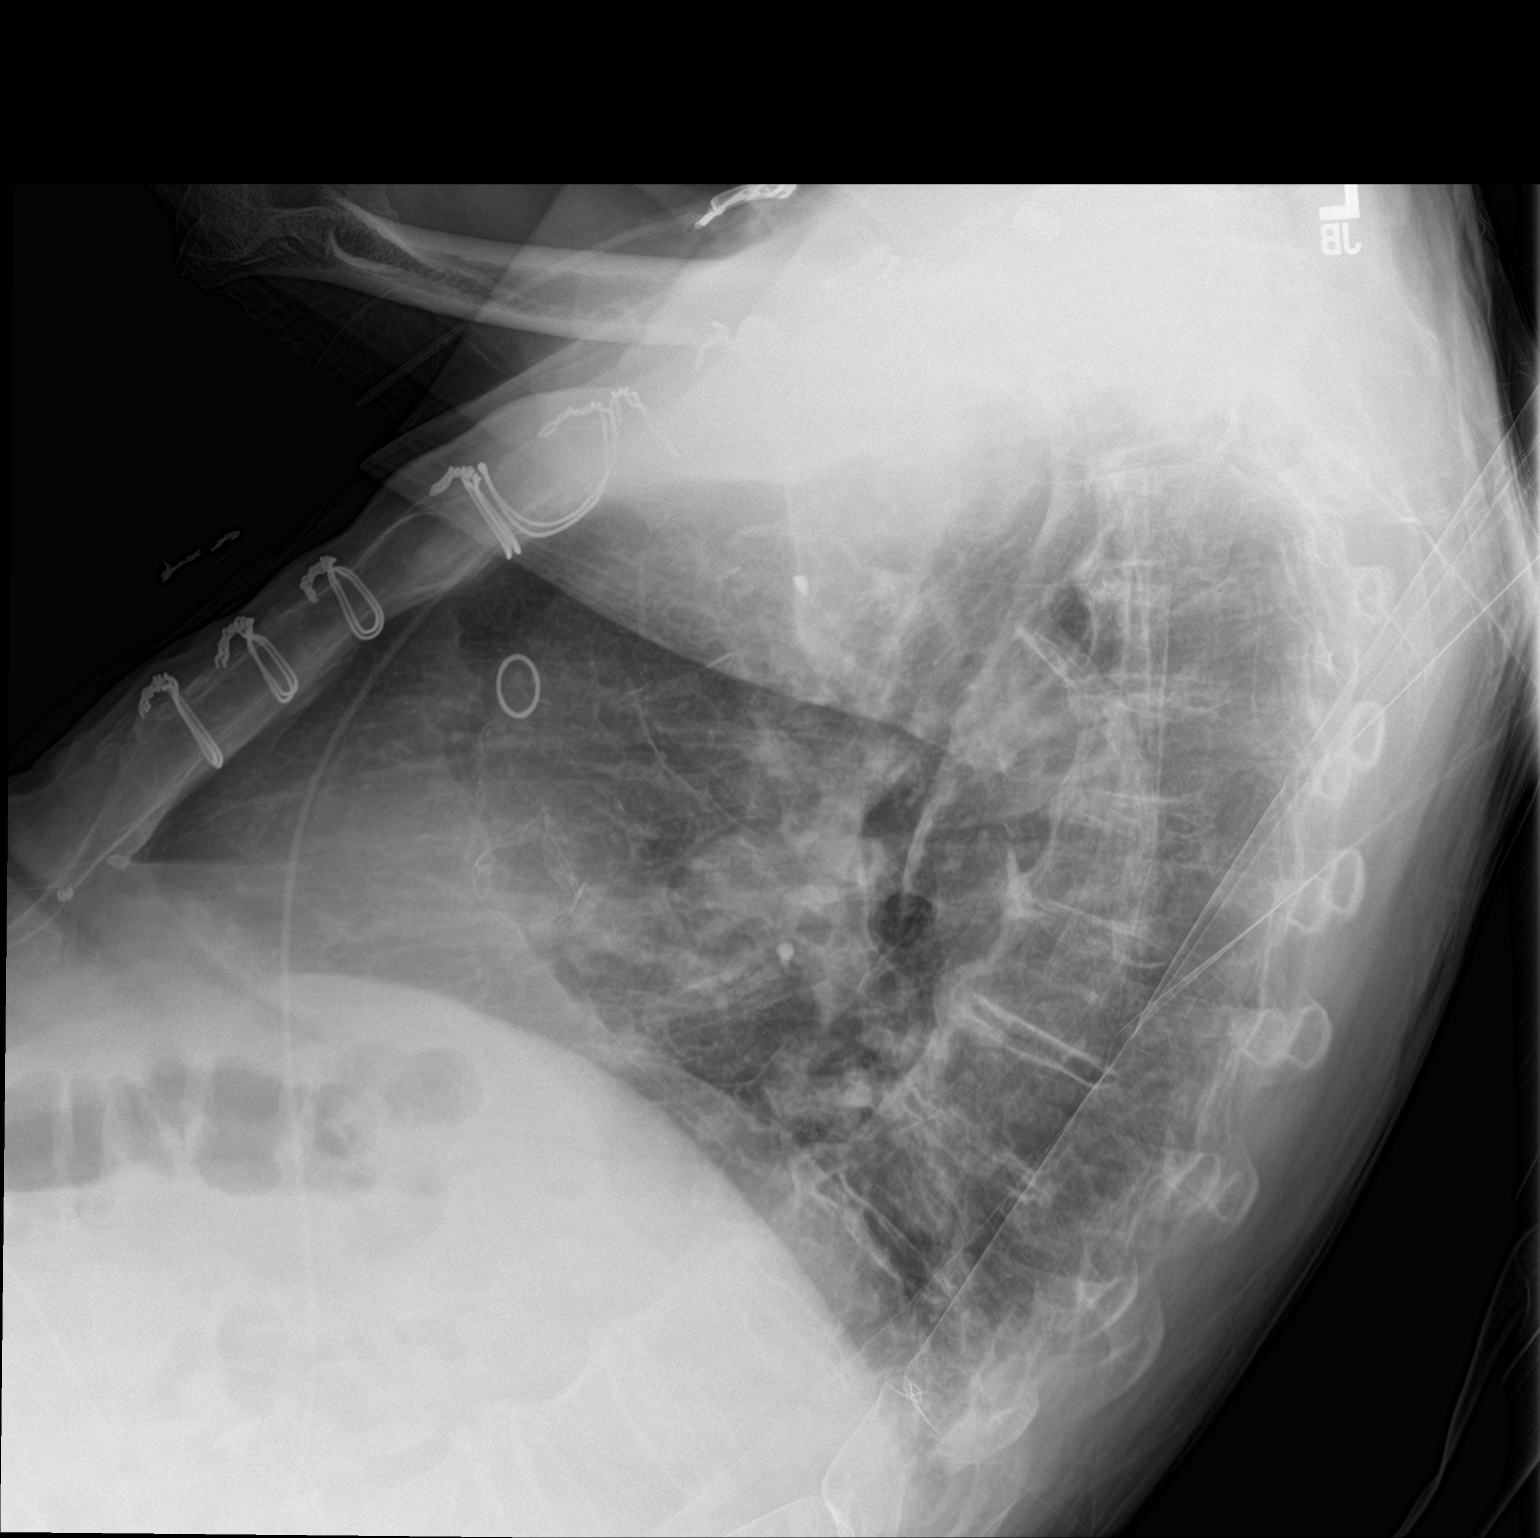

[chest ap]
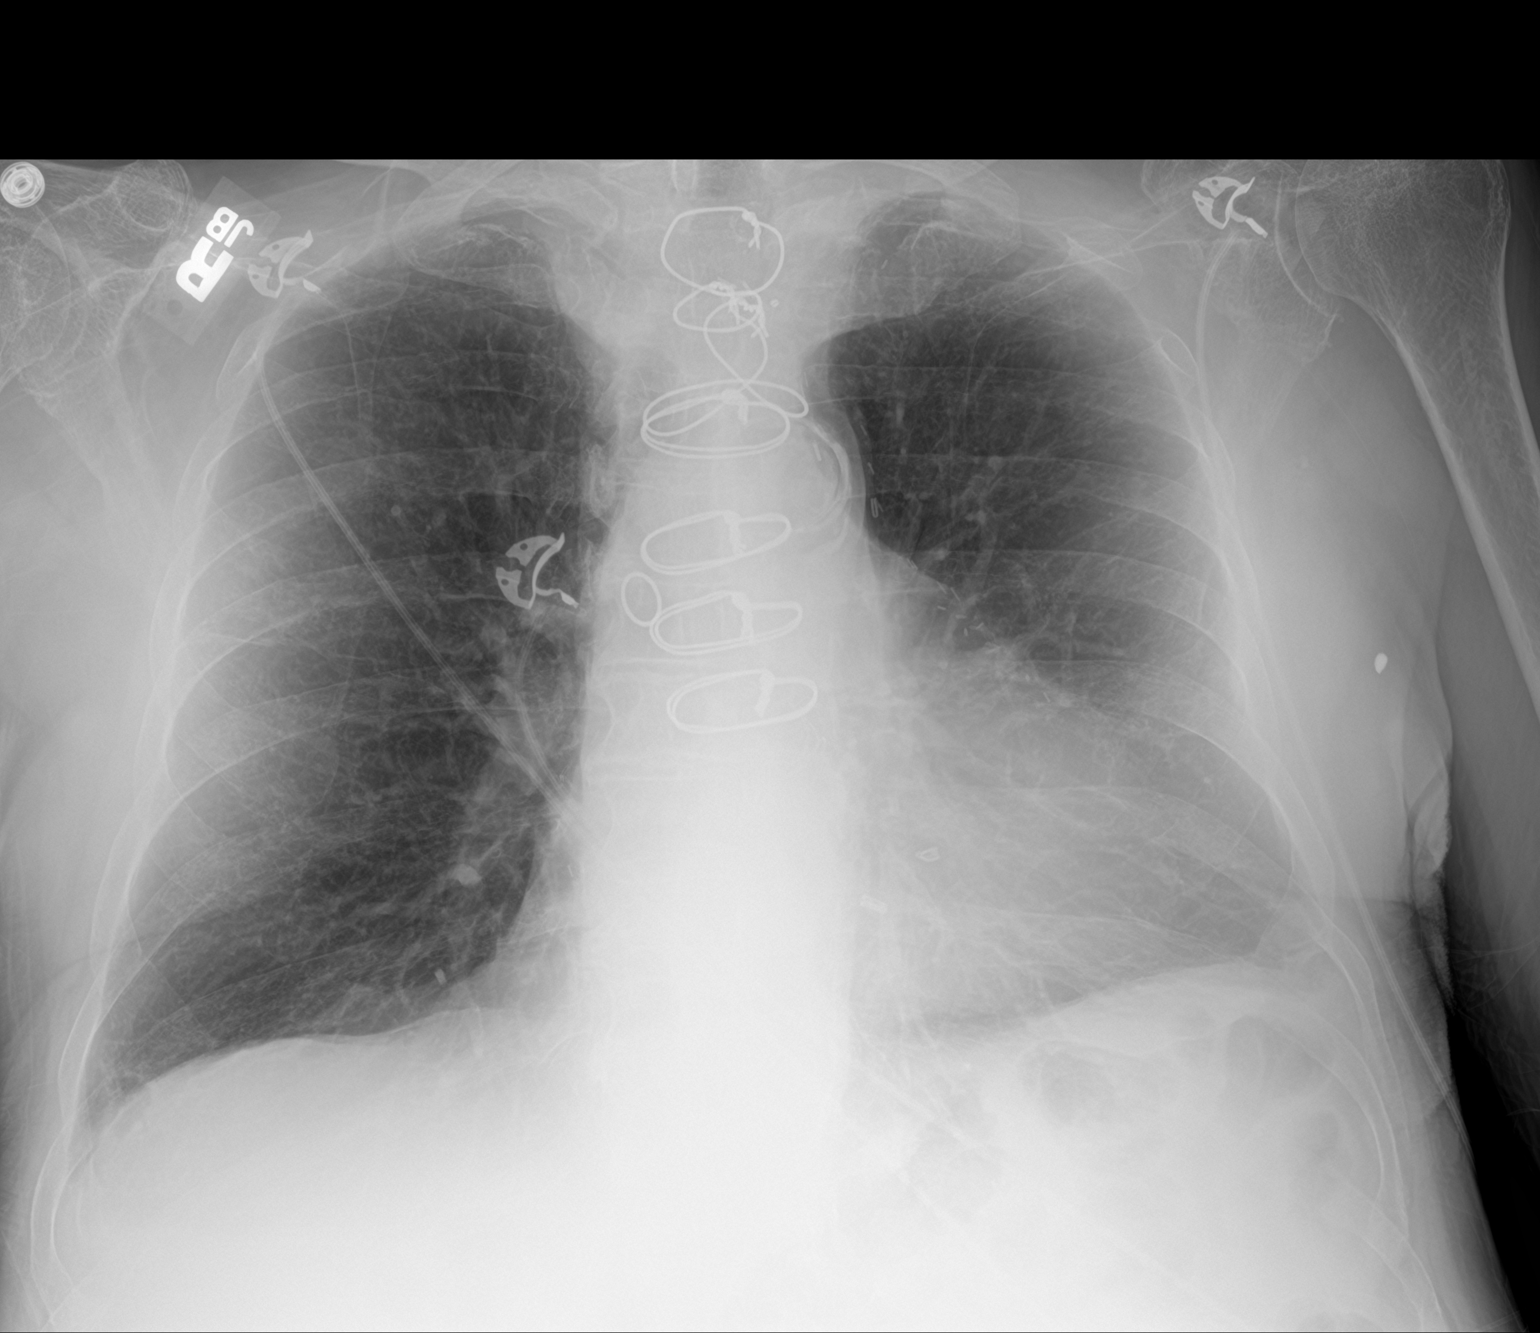

[2 of 2 positions shown; findings below may reference images not displayed]

FINDINGS: Prior CABG. Heart is mildly enlarged. Improving aeration at the left
base with minimal residual left effusion and left base atelectasis.
Right lung is clear. Aortic atherosclerosis.
IMPRESSION: Improving aeration at the left base with decreasing left effusion
and atelectasis.
